# Patient Record
Sex: Female | Born: 1975 | Race: Black or African American | Hispanic: No | Marital: Single | Smoking: Former smoker
Health system: Southern US, Community
[De-identification: ages and names within clinical notes are randomized; demographics above are authoritative.]

## PROBLEM LIST (undated history)

## (undated) DIAGNOSIS — J45909 Unspecified asthma, uncomplicated: Secondary | ICD-10-CM

## (undated) DIAGNOSIS — R519 Headache, unspecified: Secondary | ICD-10-CM

## (undated) DIAGNOSIS — R51 Headache: Secondary | ICD-10-CM

## (undated) HISTORY — DX: Headache, unspecified: R51.9

## (undated) HISTORY — DX: Headache: R51

## (undated) HISTORY — PX: TUBAL LIGATION: SHX77

---

## 2003-08-23 HISTORY — PX: OOPHORECTOMY: SHX86

## 2004-06-13 ENCOUNTER — Emergency Department: Payer: Self-pay | Admitting: Emergency Medicine

## 2004-08-23 ENCOUNTER — Emergency Department: Payer: Self-pay | Admitting: Emergency Medicine

## 2004-08-24 ENCOUNTER — Emergency Department: Payer: Self-pay | Admitting: Emergency Medicine

## 2004-08-26 ENCOUNTER — Emergency Department: Payer: Self-pay | Admitting: Unknown Physician Specialty

## 2004-09-10 ENCOUNTER — Ambulatory Visit: Payer: Self-pay | Admitting: Family Medicine

## 2004-09-21 ENCOUNTER — Ambulatory Visit: Payer: Self-pay | Admitting: Neurology

## 2004-11-07 ENCOUNTER — Emergency Department: Payer: Self-pay | Admitting: Unknown Physician Specialty

## 2005-01-20 ENCOUNTER — Observation Stay: Payer: Self-pay

## 2005-02-07 ENCOUNTER — Observation Stay: Payer: Self-pay | Admitting: Obstetrics and Gynecology

## 2005-02-08 ENCOUNTER — Observation Stay: Payer: Self-pay | Admitting: Obstetrics and Gynecology

## 2005-02-20 ENCOUNTER — Observation Stay: Payer: Self-pay | Admitting: Obstetrics and Gynecology

## 2005-02-21 ENCOUNTER — Observation Stay: Payer: Self-pay | Admitting: Obstetrics and Gynecology

## 2005-02-24 ENCOUNTER — Observation Stay: Payer: Self-pay | Admitting: Obstetrics and Gynecology

## 2005-03-01 ENCOUNTER — Inpatient Hospital Stay: Payer: Self-pay

## 2005-07-01 ENCOUNTER — Emergency Department: Payer: Self-pay | Admitting: Emergency Medicine

## 2006-07-09 ENCOUNTER — Emergency Department: Payer: Self-pay | Admitting: Emergency Medicine

## 2007-11-26 ENCOUNTER — Observation Stay: Payer: Self-pay

## 2007-12-07 ENCOUNTER — Observation Stay: Payer: Self-pay | Admitting: Obstetrics and Gynecology

## 2007-12-10 ENCOUNTER — Observation Stay: Payer: Self-pay

## 2007-12-22 ENCOUNTER — Inpatient Hospital Stay: Payer: Self-pay | Admitting: Obstetrics and Gynecology

## 2009-12-25 ENCOUNTER — Emergency Department: Payer: Self-pay | Admitting: Internal Medicine

## 2010-02-11 ENCOUNTER — Emergency Department: Payer: Self-pay | Admitting: Emergency Medicine

## 2010-02-20 ENCOUNTER — Emergency Department: Payer: Self-pay | Admitting: Emergency Medicine

## 2010-04-29 ENCOUNTER — Emergency Department: Payer: Self-pay | Admitting: Emergency Medicine

## 2012-01-09 ENCOUNTER — Emergency Department: Payer: Self-pay | Admitting: Emergency Medicine

## 2012-04-23 ENCOUNTER — Emergency Department: Payer: Self-pay | Admitting: Emergency Medicine

## 2012-06-27 ENCOUNTER — Emergency Department: Payer: Self-pay

## 2012-07-02 ENCOUNTER — Encounter: Payer: Self-pay | Admitting: Family Medicine

## 2012-07-22 ENCOUNTER — Encounter: Payer: Self-pay | Admitting: Family Medicine

## 2012-11-05 ENCOUNTER — Emergency Department: Payer: Self-pay | Admitting: Unknown Physician Specialty

## 2012-11-05 LAB — URINALYSIS, COMPLETE
Bilirubin,UR: NEGATIVE
Blood: NEGATIVE
Glucose,UR: NEGATIVE mg/dL (ref 0–75)
Hyaline Cast: 1
Ketone: NEGATIVE
Nitrite: POSITIVE
Protein: 30
WBC UR: 10 /HPF (ref 0–5)

## 2013-06-19 ENCOUNTER — Encounter: Payer: Self-pay | Admitting: Family Medicine

## 2013-06-22 ENCOUNTER — Encounter: Payer: Self-pay | Admitting: Family Medicine

## 2013-07-22 ENCOUNTER — Encounter: Payer: Self-pay | Admitting: Family Medicine

## 2013-08-22 ENCOUNTER — Encounter: Payer: Self-pay | Admitting: Family Medicine

## 2016-08-13 ENCOUNTER — Emergency Department
Admission: EM | Admit: 2016-08-13 | Discharge: 2016-08-13 | Disposition: A | Discharge status: Home / Self Care | Payer: 59 | Attending: Student in an Organized Health Care Education/Training Program | Admitting: Student in an Organized Health Care Education/Training Program

## 2016-08-13 ENCOUNTER — Emergency Department: Payer: 59

## 2016-08-13 DIAGNOSIS — R0602 Shortness of breath: Secondary | ICD-10-CM | POA: Diagnosis present

## 2016-08-13 DIAGNOSIS — J45909 Unspecified asthma, uncomplicated: Secondary | ICD-10-CM | POA: Diagnosis not present

## 2016-08-13 DIAGNOSIS — J209 Acute bronchitis, unspecified: Secondary | ICD-10-CM | POA: Insufficient documentation

## 2016-08-13 DIAGNOSIS — Z87891 Personal history of nicotine dependence: Secondary | ICD-10-CM | POA: Diagnosis not present

## 2016-08-13 HISTORY — DX: Unspecified asthma, uncomplicated: J45.909

## 2016-08-13 LAB — CBC
HEMATOCRIT: 34.9 % — AB (ref 35.0–47.0)
Hemoglobin: 12.1 g/dL (ref 12.0–16.0)
MCH: 30.4 pg (ref 26.0–34.0)
MCHC: 34.8 g/dL (ref 32.0–36.0)
MCV: 87.3 fL (ref 80.0–100.0)
Platelets: 220 10*3/uL (ref 150–440)
RBC: 3.99 MIL/uL (ref 3.80–5.20)
RDW: 13.2 % (ref 11.5–14.5)
WBC: 7.7 10*3/uL (ref 3.6–11.0)

## 2016-08-13 LAB — BASIC METABOLIC PANEL
Anion gap: 6 (ref 5–15)
BUN: 12 mg/dL (ref 6–20)
CHLORIDE: 109 mmol/L (ref 101–111)
CO2: 25 mmol/L (ref 22–32)
Calcium: 8.7 mg/dL — ABNORMAL LOW (ref 8.9–10.3)
Creatinine, Ser: 0.83 mg/dL (ref 0.44–1.00)
GFR calc non Af Amer: >60 mL/min (ref >60)
Glucose, Bld: 114 mg/dL — ABNORMAL HIGH (ref 65–99)
POTASSIUM: 3.3 mmol/L — AB (ref 3.5–5.1)
SODIUM: 140 mmol/L (ref 135–145)

## 2016-08-13 LAB — TROPONIN I: Troponin I: <0.03 ng/mL (ref <0.03)

## 2016-08-13 MED ORDER — ALBUTEROL SULFATE HFA 108 (90 BASE) MCG/ACT IN AERS: 2.0000 | INHALATION_SPRAY | Freq: Four times a day (QID) | RESPIRATORY_TRACT | 0 refills | Status: DC | PRN

## 2016-08-13 MED ORDER — IPRATROPIUM-ALBUTEROL 0.5-2.5 (3) MG/3ML IN SOLN
3.0000 mL | Freq: Once | RESPIRATORY_TRACT | Status: AC
  Administered 2016-08-13: 3 mL via RESPIRATORY_TRACT
  Filled 2016-08-13: qty 3

## 2016-08-13 MED ORDER — PREDNISONE 20 MG PO TABS
60.0000 mg | ORAL_TABLET | Freq: Once | ORAL | Status: AC
  Administered 2016-08-13: 60 mg via ORAL
  Filled 2016-08-13: qty 3

## 2016-08-13 MED ORDER — PREDNISONE 20 MG PO TABS: 40.0000 mg | ORAL_TABLET | Freq: Every day | ORAL | 0 refills | Status: AC

## 2016-08-13 NOTE — ED Notes (Signed)
Patient ambulated in the room, pulse ox stayed at 98%, pulse 95.

## 2016-08-13 NOTE — ED Triage Notes (Signed)
Pt reports to ED w/ c/o SOB and CP.  Pt sts that S/S have been ongoing >1 week, feels like getting worse today.  Pt alert and oriented, ambulatory to triage room.  Pt sts pain worse when moving around at work.  Reports intermittent cough.

## 2016-08-13 NOTE — ED Provider Notes (Signed)
[facility]    First MD Initiated Contact with Patient 08/13/16 1450     (approximate)  I have reviewed the triage vital signs and the nursing notes.   HISTORY  Chief Complaint Chest Pain and Shortness of Breath    HPI Norma Dunn is a 40 y.o. female  with a history of asthma presents with 1 week of worsening shortness of breath and intermittent chest pain. Denies any fevers. States that the shortness of breath is worse with exertion. Has had an intermittent cough but is nonproductive. States that she did have resident chest pain that started early this morning while at work. Denies any nausea or vomiting. No diaphoresis. No previous history of heart disease.   Past Medical History:  Diagnosis Date  . Asthma    No family history on file. Past Surgical History:  Procedure Laterality Date  . OOPHORECTOMY  2005   right  . TUBAL LIGATION     left  . TUBAL LIGATION     left   There are no active problems to display for this patient.     Prior to Admission medications   Not on File    Allergies Latex    Social History Social History  Substance Use Topics  . Smoking status: Former Games developermoker  . Smokeless tobacco: Never Used  . Alcohol use No    Review of Systems Patient denies headaches, rhinorrhea, blurry vision, numbness, shortness of breath, chest pain, edema, cough, abdominal pain, nausea, vomiting, diarrhea, dysuria, fevers, rashes or hallucinations unless otherwise stated above in HPI. ____________________________________________   PHYSICAL EXAM:  VITAL SIGNS: Vitals:   08/13/16 1320 08/13/16 1455  BP: 96/69 (!) 120/93  Pulse: 76 71  Resp: 18 16  Temp: 98.3 F (36.8 C)     Constitutional: Alert and oriented. Well appearing and in no acute distress. Eyes: Conjunctivae are normal. PERRL. EOMI. Head: Atraumatic. Nose: No congestion/rhinnorhea. Mouth/Throat: Mucous membranes are moist.   Oropharynx non-erythematous. Neck: No stridor. Painless ROM. No cervical spine tenderness to palpation Hematological/Lymphatic/Immunilogical: No cervical lymphadenopathy. Cardiovascular: Normal rate, regular rhythm. Grossly normal heart sounds.  Good peripheral circulation. Respiratory: Normal respiratory effort.  No retractions. Lungs with faint bilateral end expiratory wheeze Gastrointestinal: Soft and nontender. No distention. No abdominal bruits. No CVA tenderness. Genitourinary:  Musculoskeletal: No lower extremity tenderness nor edema.  No joint effusions. Neurologic:  Normal speech and language. No gross focal neurologic deficits are appreciated. No gait instability. Skin:  Skin is warm, dry and intact. No rash noted. Psychiatric: Mood and affect are normal. Speech and behavior are normal.  ____________________________________________   LABS (all labs ordered are listed, but only abnormal results are displayed)  Results for orders placed or performed during the hospital encounter of 08/13/16 (from the past 24 hour(s))  Basic metabolic panel     Status: Abnormal   Collection Time: 08/13/16  1:24 PM  Result Value Ref Range   Sodium 140 135 - 145 mmol/L   Potassium 3.3 (L) 3.5 - 5.1 mmol/L   Chloride 109 101 - 111 mmol/L   CO2 25 22 - 32 mmol/L   Glucose, Bld 114 (H) 65 - 99 mg/dL   BUN 12 6 - 20 mg/dL   Creatinine, Ser 1.610.83 0.44 - 1.00 mg/dL   Calcium 8.7 (L) 8.9 - 10.3 mg/dL   GFR calc non Af Amer >60 >60 mL/min   GFR calc Af Amer >60 >60 mL/min   Anion gap 6  5 - 15  CBC     Status: Abnormal   Collection Time: 08/13/16  1:24 PM  Result Value Ref Range   WBC 7.7 3.6 - 11.0 K/uL   RBC 3.99 3.80 - 5.20 MIL/uL   Hemoglobin 12.1 12.0 - 16.0 g/dL   HCT 16.134.9 (L) 09.635.0 - 04.547.0 %   MCV 87.3 80.0 - 100.0 fL   MCH 30.4 26.0 - 34.0 pg   MCHC 34.8 32.0 - 36.0 g/dL   RDW 40.913.2 81.111.5 - 91.414.5 %   Platelets 220 150 - 440 K/uL  Troponin I     Status: None   Collection Time: 08/13/16   1:24 PM  Result Value Ref Range   Troponin I <0.03 <0.03 ng/mL   ____________________________________________  EKG My review and personal interpretation at Time: 13:22   Indication: sob  Rate: 80  Rhythm: sinus Axis: normal Other: non specific st changes, no acute ischemia ____________________________________________  RADIOLOGY  I personally reviewed all radiographic images ordered to evaluate for the above acute complaints and reviewed radiology reports and findings.  These findings were personally discussed with the patient.  Please see medical record for radiology report.____________________________________________   PROCEDURES  Procedure(s) performed:  Procedures    Critical Care performed: no ____________________________________________   INITIAL IMPRESSION / ASSESSMENT AND PLAN / ED COURSE  Pertinent labs & imaging results that were available during my care of the patient were reviewed by me and considered in my medical decision making (see chart for details).  DDX: Asthma, copd, CHF, pna, ptx, malignancy, Pe, anemia  Norma Dunn is a 40 y.o. who presents to the ED with chest pain shortness of breath. Presentation is most consistent with acute bronchitis possible asthma. She is low risk Wells criteria and is PERC negative. Her EKG shows no evidence of acute ischemia. Blood work is otherwise reassuring. We will order nebulizer treatment and started on steroids for bronchitis. Her chest X rashes no evidence of consolidation.  Clinical Course as of Aug 13 2128  Sat Aug 13, 2016  1749 Patient was able to tolerate PO and was able to ambulate with a steady gait.  Have discussed with the patient and available family all diagnostics and treatments performed thus far and all questions were answered to the best of my ability. The patient demonstrates understanding and agreement with plan.   [PR]    Clinical Course User Index [PR] Willy EddyPatrick Samanthan Dugo, MD      ____________________________________________   FINAL CLINICAL IMPRESSION(S) / ED DIAGNOSES  Final diagnoses:  Acute bronchitis, unspecified organism      NEW MEDICATIONS STARTED DURING THIS VISIT:  New Prescriptions   No medications on file     Note:  This document was prepared using Dragon voice recognition software and may include unintentional dictation errors.    Willy EddyPatrick Nils Thor, MD 08/13/16 [phone]

## 2016-11-11 ENCOUNTER — Encounter: Payer: Self-pay | Admitting: Emergency Medicine

## 2016-11-11 ENCOUNTER — Emergency Department
Admission: EM | Admit: 2016-11-11 | Discharge: 2016-11-11 | Disposition: A | Discharge status: Home / Self Care | Payer: 59 | Attending: Emergency Medicine | Admitting: Emergency Medicine

## 2016-11-11 DIAGNOSIS — J45909 Unspecified asthma, uncomplicated: Secondary | ICD-10-CM | POA: Insufficient documentation

## 2016-11-11 DIAGNOSIS — G43909 Migraine, unspecified, not intractable, without status migrainosus: Secondary | ICD-10-CM | POA: Diagnosis present

## 2016-11-11 DIAGNOSIS — Z87891 Personal history of nicotine dependence: Secondary | ICD-10-CM | POA: Insufficient documentation

## 2016-11-11 DIAGNOSIS — G43001 Migraine without aura, not intractable, with status migrainosus: Secondary | ICD-10-CM | POA: Insufficient documentation

## 2016-11-11 DIAGNOSIS — Z79899 Other long term (current) drug therapy: Secondary | ICD-10-CM | POA: Diagnosis not present

## 2016-11-11 MED ORDER — DIPHENHYDRAMINE HCL 50 MG/ML IJ SOLN
12.5000 mg | Freq: Once | INTRAMUSCULAR | Status: AC
  Administered 2016-11-11: 12.5 mg via INTRAVENOUS
  Filled 2016-11-11: qty 1

## 2016-11-11 MED ORDER — KETOROLAC TROMETHAMINE 30 MG/ML IJ SOLN
30.0000 mg | Freq: Once | INTRAMUSCULAR | Status: AC
  Administered 2016-11-11: 30 mg via INTRAVENOUS
  Filled 2016-11-11: qty 1

## 2016-11-11 MED ORDER — METOCLOPRAMIDE HCL 5 MG/ML IJ SOLN
10.0000 mg | Freq: Once | INTRAMUSCULAR | Status: AC
  Administered 2016-11-11: 10 mg via INTRAVENOUS
  Filled 2016-11-11: qty 2

## 2016-11-11 NOTE — ED Provider Notes (Signed)
[facility] Emergency Department Provider Note   First MD Initiated Contact with Patient 11/11/16 [phone]     (approximate)  I have reviewed the triage vital signs and the nursing notes.   HISTORY  Chief Complaint Migraine    HPI Norma Dunn is a 41 y.o. female presents with history of migraine headache current "migraine 1 week. Patient states however for the past month her headaches have been intermittent. Patient states that she was given a prescription for sumatriptan. Patient states current pain score 7 out of 10. Patient denies any weakness numbness gait instability or visual changes. Patient denies any vomiting. Patient denies any fever neck pain or stiffness. Patient states current migraine is consistent with previous migraine headaches.   Past Medical History:  Diagnosis Date  . Asthma     There are no active problems to display for this patient.   Past Surgical History:  Procedure Laterality Date  . OOPHORECTOMY  2005   right  . TUBAL LIGATION     left  . TUBAL LIGATION     left    Prior to Admission medications   Medication Sig Start Date End Date Taking? Authorizing Provider  albuterol (PROVENTIL HFA;VENTOLIN HFA) 108 (90 Base) MCG/ACT inhaler Inhale 2 puffs into the lungs every 6 (six) hours as needed for wheezing or shortness of breath. 08/13/16  Yes Willy Eddy, MD  beclomethasone (QVAR) 80 MCG/ACT inhaler Inhale 2 puffs into the lungs 2 (two) times daily.   Yes Historical Provider, MD  SUMAtriptan (IMITREX) 50 MG tablet Take 50 mg by mouth every 2 (two) hours as needed for migraine or headache. May repeat in 2 hours if headache persists or recurs.   Yes Historical Provider, MD    Allergies Latex  No family history on file.  Social History Social History  Substance Use Topics  . Smoking status: Former Games developer  . Smokeless tobacco: Never Used  . Alcohol use No    Review of Systems Constitutional: No  fever/chills Eyes: No visual changes. ENT: No sore throat. Cardiovascular: Denies chest pain. Respiratory: Denies shortness of breath. Gastrointestinal: No abdominal pain.  No nausea, no vomiting.  No diarrhea.  No constipation. Genitourinary: Negative for dysuria. Musculoskeletal: Negative for back pain. Skin: Negative for rash. Neurological: Positive for headaches, negative for focal weakness or numbness.  10-point ROS otherwise negative.  ____________________________________________   PHYSICAL EXAM:  VITAL SIGNS: ED Triage Vitals [11/11/16 0428]  Enc Vitals Group     BP (!) 126/91     Pulse Rate 69     Resp 18     Temp 98.5 F (36.9 C)     Temp Source Oral     SpO2 99 %     Weight 150 lb (68 kg)     Height 5\' 4"  (1.626 m)     Head Circumference      Peak Flow      Pain Score 8     Pain Loc      Pain Edu?      Excl. in GC?     Constitutional: Alert and oriented. Well appearing and in no acute distress. Eyes: Conjunctivae are normal. PERRL. EOMI. Head: Atraumatic. Mouth/Throat: Mucous membranes are moist. Oropharynx non-erythematous. Neck: No stridor.  No meningeal signs.   Cardiovascular: Normal rate, regular rhythm. Good peripheral circulation. Grossly normal heart sounds. Respiratory: Normal respiratory effort.  No retractions. Lungs CTAB. Gastrointestinal: Soft and nontender. No distention.  Musculoskeletal: No lower extremity tenderness nor  edema. No gross deformities of extremities. Neurologic:  Normal speech and language. No gross focal neurologic deficits are appreciated.  Skin:  Skin is warm, dry and intact. No rash noted. Psychiatric: Mood and affect are normal. Speech and behavior are normal.       Procedures   ____________________________________________   INITIAL IMPRESSION / ASSESSMENT AND PLAN / ED COURSE  Pertinent labs & imaging results that were available during my care of the patient were reviewed by me and considered in my medical  decision making (see chart for details).  Patient given IV Toradol Reglan and Benadryl with complete resolution of headache. In absence of any focal neurological deficits on exam and migraine headache consistent with previous no imaging was performed today.      ____________________________________________  FINAL CLINICAL IMPRESSION(S) / ED DIAGNOSES  Final diagnoses:  Migraine without aura and with status migrainosus, not intractable     MEDICATIONS GIVEN DURING THIS VISIT:  Medications  ketorolac (TORADOL) 30 MG/ML injection 30 mg (30 mg Intravenous Given 11/11/16 0445)  metoCLOPramide (REGLAN) injection 10 mg (10 mg Intravenous Given 11/11/16 0444)  diphenhydrAMINE (BENADRYL) injection 12.5 mg (12.5 mg Intravenous Given 11/11/16 0444)     NEW OUTPATIENT MEDICATIONS STARTED DURING THIS VISIT:  New Prescriptions   No medications on file    Modified Medications   No medications on file    Discontinued Medications   ALBUTEROL SULFATE (VENTOLIN HFA IN)    Inhale 2 puffs into the lungs daily.     Note:  This document was prepared using Dragon voice recognition software and may include unintentional dictation errors.    Darci Currentandolph N Laylah Riga, MD 11/11/16 [phone]

## 2016-11-11 NOTE — ED Triage Notes (Signed)
Has had migraine for a month, went to pmd and got rx that did not give her relief.

## 2017-04-20 ENCOUNTER — Encounter: Payer: Self-pay | Admitting: Neurology

## 2017-07-20 ENCOUNTER — Ambulatory Visit (INDEPENDENT_AMBULATORY_CARE_PROVIDER_SITE_OTHER): Payer: 59 | Admitting: Neurology

## 2017-07-20 ENCOUNTER — Encounter: Payer: Self-pay | Admitting: Neurology

## 2017-07-20 VITALS — BP 100/72 | HR 73 | Ht 65.0 in | Wt 157.4 lb

## 2017-07-20 DIAGNOSIS — G43019 Migraine without aura, intractable, without status migrainosus: Secondary | ICD-10-CM

## 2017-07-20 NOTE — Patient Instructions (Signed)
Migraine Recommendations: 1.  Start nortriptyline 25mg  at bedtime.  Call in 8 weeks ate and we can adjust dose if needed. 2.  Take rizatriptan 10mg  at earliest onset of headache.  May repeat dose once in 2 hours if needed.  Do not exceed two tablets in 24 hours. 3.  Stop ibuprofen and Excedrin.  Limit use of pain relievers to no more than 2 days out of the week.  These medications include acetaminophen, ibuprofen, triptans and narcotics.  This will help reduce risk of rebound headaches. 4.  Be aware of common food triggers such as processed sweets, processed foods with nitrites (such as deli meat, hot dogs, sausages), foods with MSG, alcohol (such as wine), chocolate, certain cheeses, certain fruits (dried fruits, bananas, some citrus fruit), vinegar, diet soda. 4.  Avoid caffeine 5.  Routine exercise 6.  Proper sleep hygiene 7.  Stay adequately hydrated with water 8.  Keep a headache diary. 9.  Maintain proper stress management. 10.  Do not skip meals. 11.  Consider supplements:  Magnesium citrate 400mg  to 600mg  daily, riboflavin 400mg , Coenzyme Q 10 100mg  three times daily 12.  Follow up in 3 months.

## 2017-07-20 NOTE — Progress Notes (Signed)
NEUROLOGY CONSULTATION NOTE  Norma Dunn MRN: [id number] DOB: 1975/09/11  Referring provider: Shane Crutch, PA-C Primary care provider: Shane Crutch, PA-C  Reason for consult:  migraines  HISTORY OF PRESENT ILLNESS: Norma Dunn is a 41 year old female who presents for migraines.  History supplemented by ED note.  Onset:  Since her 7s. Location:  Usually bi-frontal or either side Quality:  Pressure, sometimes sharp Intensity:  Moderate to severe Aura:  no Prodrome:  no Postdrome:  Sometimes fatigue Associated symptoms:  Nausea, photophobia, phonophobia, blurred vision in left eye, no vomiting.  She has not had any new worse headache of her life, waking up from sleep Duration:  Usually 2 days (but up to a week) Frequency:  Varies.  Some months up to 18 days per month but she may have 1 or 2 months with little or no headache. Frequency of abortive medication: as needed. Triggers/exacerbating factors:  Menstrual cycle, stress Relieving factors:  nothing Activity:  Aggravates.  Cannot function or go to work 5 days a month.  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  Past NSAIDS:  no Past analgesics:  no Past abortive triptans:  Sumatriptan (ineffective) Past muscle relaxants:  no Past anti-emetic:  Reglan (effective) Past antihypertensive medications:  no Past antidepressant medications:  no Past anticonvulsant medications:  no Past vitamins/Herbal/Supplements:  no Other past therapies:  no  Current NSAIDS:  ibuprofen 800mg  Current analgesics:  Excedrin Current triptans:  no Current anti-emetic:  Promethazine 25mg  (effective but causes drowsiness) Current muscle relaxants:  no Current anti-anxiolytic:  no Current sleep aide:  no Current Antihypertensive medications:  no Current  Antidepressant medications:  no Current Anticonvulsant medications:  no Current Vitamins/Herbal/Supplements:  no Current Antihistamines/Decongestants:  no Other therapy:  no  Caffeine:  rarely Alcohol:  no Smoker:  no Diet:  Hydrates.  Rarely soda.  Watches diet. 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 Asthma   . Headache     PAST SURGICAL HISTORY: Past Surgical History:  Procedure Laterality Date  . OOPHORECTOMY  2005   right  . TUBAL LIGATION     left  . TUBAL LIGATION     left    MEDICATIONS: Current Outpatient Medications on File Prior to Visit  Medication Sig Dispense Refill  . aspirin-acetaminophen-caffeine (EXCEDRIN MIGRAINE) 250-250-65 MG tablet Take 1 tablet by mouth every 6 (six) hours as needed for headache.    . albuterol (PROVENTIL HFA;VENTOLIN HFA) 108 (90 Base) MCG/ACT inhaler Inhale 2 puffs into the lungs every 6 (six) hours as needed for wheezing or shortness of breath. 1 Inhaler 0  . beclomethasone (QVAR) 80 MCG/ACT inhaler Inhale 2 puffs into the lungs 2 (two) times daily.    . IBU 800 MG tablet Take 1 tablet by mouth 2 (two) times daily as needed.    . SUMAtriptan (IMITREX) 50 MG tablet Take 50 mg by mouth every 2 (two) hours as needed for migraine or headache. May repeat in 2 hours if headache persists or recurs.     No current facility-administered medications on file prior to visit.     ALLERGIES: Allergies  Allergen Reactions  . Latex Itching    FAMILY HISTORY: Family  History  Problem Relation Age of Onset  . Migraines Father     SOCIAL HISTORY: Social History   Socioeconomic History  . Marital status: Unknown    Spouse name: Not on file  . Number of children: 3  . Years of education: Not on file  . Highest education level: Some college, no degree  Social Needs  .  Financial resource strain: Not on file  . Food insecurity - worry: Not on file  . Food insecurity - inability: Not on file  . Transportation needs - medical: Not on file  . Transportation needs - non-medical: Not on file  Occupational History  . Occupation: Surveyor, mineralspostal clerk    Employer: [employer]  Tobacco Use  . Smoking status: Former Games developermoker  . Smokeless tobacco: Never Used  Substance and Sexual Activity  . Alcohol use: No  . Drug use: Not on file  . Sexual activity: Not on file  Other Topics Concern  . Not on file  Social History Narrative   Single, lives with her 3 children. Has been avoiding caffeine. Exercises 2-3 x week for 60 minutes or more.    REVIEW OF SYSTEMS: Constitutional: No fevers, chills, or sweats, no generalized fatigue, change in appetite Eyes: No visual changes, double vision, eye pain Ear, nose and throat: No hearing loss, ear pain, nasal congestion, sore throat Cardiovascular: No chest pain, palpitations Respiratory:  No shortness of breath at rest or with exertion, wheezes GastrointestinaI: No nausea, vomiting, diarrhea, abdominal pain, fecal incontinence Genitourinary:  No dysuria, urinary retention or frequency Musculoskeletal:  No neck pain, back pain Integumentary: No rash, pruritus, skin lesions Neurological: as above Psychiatric: No depression, insomnia, anxiety Endocrine: No palpitations, fatigue, diaphoresis, mood swings, change in appetite, change in weight, increased thirst Hematologic/Lymphatic:  No purpura, petechiae. Allergic/Immunologic: no itchy/runny eyes, nasal congestion, recent allergic reactions, rashes  PHYSICAL EXAM: Vitals:   07/20/17 0745  BP: 100/72  Pulse: 73  SpO2: 98%   General: No acute distress.  Patient appears well-groomed.  Head:  Normocephalic/atraumatic Eyes:  fundi examined but not visualized Neck: supple, no paraspinal tenderness, full range of motion Back: No paraspinal tenderness Heart: regular rate and  rhythm Lungs: Clear to auscultation bilaterally. Vascular: No carotid bruits. Neurological Exam: Mental status: alert and oriented to person, place, and time, recent and remote memory intact, fund of knowledge intact, attention and concentration intact, speech fluent and not dysarthric, language intact. Cranial nerves: CN I: not tested CN II: pupils equal, round and reactive to light, visual fields intact CN III, IV, VI:  full range of motion, no nystagmus, no ptosis CN V: facial sensation intact CN VII: upper and lower face symmetric CN VIII: hearing intact CN IX, X: gag intact, uvula midline CN XI: sternocleidomastoid and trapezius muscles intact CN XII: tongue midline Bulk & Tone: normal, no fasciculations. Motor:  5/5 throughout  Sensation: temperature and vibration sensation intact. Deep Tendon Reflexes:  2+ throughout, toes downgoing.  Finger to nose testing:  Without dysmetria.  Heel to shin:  Without dysmetria.  Gait:  Normal station and stride.  Able to turn and tandem walk. Romberg negative.  IMPRESSION: Migraine without aura  PLAN: 1.  Start nortriptyline 25mg  at bedtime 2.  Stop ibuprofen and Excedrin.  For abortive therapy, start rizatriptan 10mg  (limit to no more than 2 days out of week to prevent rebound headache) 3.  Follow up in 3 months.  Thank you for allowing me to take part in the care of this patient.  Shon MilletAdam Jaffe, DO  CC:  Shane CrutchLinda Markley, PA-C

## 2017-07-21 ENCOUNTER — Telehealth: Payer: Self-pay | Admitting: Neurology

## 2017-07-21 MED ORDER — NORTRIPTYLINE HCL 25 MG PO CAPS: 25.0000 mg | ORAL_CAPSULE | Freq: Every day | ORAL | 3 refills | Status: DC

## 2017-07-21 MED ORDER — RIZATRIPTAN BENZOATE 10 MG PO TBDP: 10.0000 mg | ORAL_TABLET | ORAL | 3 refills | Status: DC | PRN

## 2017-07-21 NOTE — Telephone Encounter (Signed)
Called Pt, advsd her Rxs have been sent in. Reminded her not to take more than 2 rizatriptans in a 24 hour period. Pt verbalized understanding.

## 2017-07-21 NOTE — Telephone Encounter (Signed)
Patient called and needs to have her medication sent into [facility] in [place]. She said they have not received anything. Thanks

## 2017-08-07 ENCOUNTER — Telehealth: Payer: Self-pay | Admitting: Neurology

## 2017-08-07 NOTE — Telephone Encounter (Signed)
Pt left a voicemail message saying the pharmacist has not received her prescription refill

## 2017-08-07 NOTE — Telephone Encounter (Signed)
Called and spoke w/Pt. She picked upm her nortriptyline on 12/1, rizatriotan was not ready. She has not rcvd notice it was available. Advsd her was sent in at same time as nortrip on 07/21/17. Called pharmacy, spoke with Dieu-hi, she said wasn't filled, but will fill now.

## 2017-10-23 ENCOUNTER — Encounter: Payer: Self-pay | Admitting: Neurology

## 2017-10-23 ENCOUNTER — Ambulatory Visit (INDEPENDENT_AMBULATORY_CARE_PROVIDER_SITE_OTHER): Payer: 59 | Admitting: Neurology

## 2017-10-23 VITALS — BP 100/70 | HR 75 | Ht 65.0 in | Wt 160.1 lb

## 2017-10-23 DIAGNOSIS — G43009 Migraine without aura, not intractable, without status migrainosus: Secondary | ICD-10-CM | POA: Diagnosis not present

## 2017-10-23 MED ORDER — ONDANSETRON HCL 4 MG PO TABS: 4.0000 mg | ORAL_TABLET | Freq: Three times a day (TID) | ORAL | 3 refills | Status: DC | PRN

## 2017-10-23 NOTE — Patient Instructions (Signed)
Migraine Recommendations: 1.  Continue nortriptyline 25mg  at bedtime 2.  Take rizatriptan 10mg  at earliest onset of headache.  May repeat dose once in 2 hours if needed.  Do not exceed two tablets in 24 hours.  For nausea, take ondansetron 4mg . 3.  Limit use of pain relievers to no more than 2 days out of the week.  These medications include acetaminophen, ibuprofen, triptans and narcotics.  This will help reduce risk of rebound headaches. 4.  Be aware of common food triggers such as processed sweets, processed foods with nitrites (such as deli meat, hot dogs, sausages), foods with MSG, alcohol (such as wine), chocolate, certain cheeses, certain fruits (dried fruits, bananas, some citrus fruit), vinegar, diet soda. 4.  Avoid caffeine 5.  Routine exercise 6.  Proper sleep hygiene 7.  Stay adequately hydrated with water 8.  Keep a headache diary. 9.  Maintain proper stress management. 10.  Do not skip meals. 11.  Consider supplements:  Magnesium citrate 400mg  to 600mg  daily, riboflavin 400mg , Coenzyme Q 10 100mg  three times daily 12.  Follow up in 4 months.

## 2017-10-23 NOTE — Progress Notes (Signed)
NEUROLOGY FOLLOW UP OFFICE NOTE  BEYA TIPPS [id number]  HISTORY OF PRESENT ILLNESS: Norma Dunn is a 42 year old female who follows up for migraines.  UPDATE: She is feeling better. Intensity:  Moderate-severe Duration:  Usually 1 to 2 hours Frequency:  Less than 5 days a month Frequency of abortive medication:  Less than 5 days a month. Rescue protocol:  Rizatriptan (repeats dose after 2 hours 10% of time) Current NSAIDS:  no Current analgesics:  no Current triptans:  rizatriptan 10mg  Current anti-emetic:  Promethazine 25mg  (effective but causes drowsiness) Current muscle relaxants:  no Current anti-anxiolytic:  no Current sleep aide:  no Current Antihypertensive medications:  no Current Antidepressant medications:  nortriptyline 25mg  Current Anticonvulsant medications:  no Current Vitamins/Herbal/Supplements:  no Current Antihistamines/Decongestants:  no Other therapy:  no   Caffeine:  rarely Alcohol:  no Smoker:  no Diet:  Hydrates.  Rarely soda.  Watches diet. Exercise:  yes Depression/anxiety:  No Sleep hygiene:  varies  HISTORY:  Onset:  Since her 4s. Location:  Usually bi-frontal or either side Quality:  Pressure, sometimes sharp Initial Intensity:  Moderate to severe Aura:  no Prodrome:  no Postdrome:  Sometimes fatigue Associated symptoms:  Nausea, photophobia, phonophobia, blurred vision in left eye, no vomiting.  She has not had any new worse headache of her life, waking up from sleep Initial Duration:  Usually 2 days (but up to a week) Initial Frequency:  Varies.  Some months up to 18 days per month but she may have 1 or 2 months with little or no headache. Initial Frequency of abortive medication: as needed. Triggers/exacerbating factors:  Menstrual cycle, stress Relieving factors:  nothing Activity:  Aggravates.  Cannot function or go to work 5 days a month.   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   Past NSAIDS:  Ibuprofen 800mg  Past analgesics:  Excedrin Past abortive triptans:  Sumatriptan (ineffective) Past muscle relaxants:  no Past anti-emetic:  Reglan (effective) Past antihypertensive medications:  no Past antidepressant medications:  no Past anticonvulsant medications:  no Past vitamins/Herbal/Supplements:  no Other past therapies:  no   Family history of headache:  Father (migraines)  PAST MEDICAL HISTORY: Past Medical History:  Diagnosis Date  . Asthma   . Headache     MEDICATIONS: Current Outpatient Medications on File Prior to Visit  Medication Sig Dispense Refill  . albuterol (PROVENTIL HFA;VENTOLIN HFA) 108 (90 Base) MCG/ACT inhaler Inhale 2 puffs into the lungs every 6 (six) hours as needed for wheezing or shortness of breath. 1 Inhaler 0  . aspirin-acetaminophen-caffeine (EXCEDRIN MIGRAINE) 250-250-65 MG tablet Take 1 tablet by mouth every 6 (six) hours as needed for headache.    . beclomethasone (QVAR) 80 MCG/ACT inhaler Inhale 2 puffs into the lungs 2 (two) times daily.    . IBU 800 MG tablet Take 1 tablet by mouth 2 (two) times daily as needed.    . nortriptyline (PAMELOR) 25 MG capsule Take 1 capsule (25 mg total) by mouth at bedtime. 30 capsule 3  . rizatriptan (MAXALT-MLT) 10 MG disintegrating tablet Take 1 tablet (10 mg total) by mouth as needed for migraine. May repeat in 2 hours if needed 9 tablet 3   No current facility-administered medications on file prior to visit.     ALLERGIES: Allergies  Allergen Reactions  . Latex  Itching    FAMILY HISTORY: Family History  Problem Relation Age of Onset  . Migraines Father     SOCIAL HISTORY: Social History   Socioeconomic History  . Marital status: Unknown    Spouse name: Not on file  . Number of children: 3  . Years of education: Not on file    . Highest education level: Some college, no degree  Social Needs  . Financial resource strain: Not on file  . Food insecurity - worry: Not on file  . Food insecurity - inability: Not on file  . Transportation needs - medical: Not on file  . Transportation needs - non-medical: Not on file  Occupational History  . Occupation: Surveyor, mineralspostal clerk    Employer: [employer]  Tobacco Use  . Smoking status: Former Games developermoker  . Smokeless tobacco: Never Used  Substance and Sexual Activity  . Alcohol use: No  . Drug use: Not on file  . Sexual activity: Not on file  Other Topics Concern  . Not on file  Social History Narrative   Single, lives with her 3 children. Has been avoiding caffeine. Exercises 2-3 x week for 60 minutes or more.    REVIEW OF SYSTEMS: Constitutional: No fevers, chills, or sweats, no generalized fatigue, change in appetite Eyes: No visual changes, double vision, eye pain Ear, nose and throat: No hearing loss, ear pain, nasal congestion, sore throat Cardiovascular: No chest pain, palpitations Respiratory:  No shortness of breath at rest or with exertion, wheezes GastrointestinaI: No nausea, vomiting, diarrhea, abdominal pain, fecal incontinence Genitourinary:  No dysuria, urinary retention or frequency Musculoskeletal:  No neck pain, back pain Integumentary: No rash, pruritus, skin lesions Neurological: as above Psychiatric: No depression, insomnia, anxiety Endocrine: No palpitations, fatigue, diaphoresis, mood swings, change in appetite, change in weight, increased thirst Hematologic/Lymphatic:  No purpura, petechiae. Allergic/Immunologic: no itchy/runny eyes, nasal congestion, recent allergic reactions, rashes  PHYSICAL EXAM: Vitals:   10/23/17 0733  BP: 100/70  Pulse: 75  SpO2: 98%   General: No acute distress.  Patient appears well-groomed.   Head:  Normocephalic/atraumatic Eyes:  Fundi examined but not visualized Neck: supple, no paraspinal tenderness, full range of  motion Heart:  Regular rate and rhythm Lungs:  Clear to auscultation bilaterally Back: No paraspinal tenderness Neurological Exam: alert and oriented to person, place, and time. Attention span and concentration intact, recent and remote memory intact, fund of knowledge intact.  Speech fluent and not dysarthric, language intact.  CN II-XII intact. Bulk and tone normal, muscle strength 5/5 throughout.  Sensation to light touch  intact.  Deep tendon reflexes 2+ throughout.  Finger to nose testing intact.  Gait normal, Romberg negative.  IMPRESSION: Migraine without aura, not intractable.    PLAN: 1.  Continue nortriptyline 25mg  at bedtime 2.  Rizatriptan 10mg  for abortive therapy.  For nausea, Zofran 4mg  (less sedating than promethazine) 3.  Limit use of pain relievers to no more than 2 days out of the week.  These medications include acetaminophen, ibuprofen, triptans and narcotics.  This will help reduce risk of rebound headaches. 4.  Be aware of common food triggers such as processed sweets, processed foods with nitrites (such as deli meat, hot dogs, sausages), foods with MSG, alcohol (such as wine), chocolate, certain cheeses, certain fruits (dried fruits, bananas, some citrus fruit), vinegar, diet soda. 4.  Avoid caffeine 5.  Routine exercise 6.  Proper sleep hygiene 7.  Stay adequately hydrated with water 8.  Keep a headache diary. 9.  Maintain  proper stress management. 10.  Do not skip meals. 11.  Consider supplements:  Magnesium citrate 400mg  to 600mg  daily, riboflavin 400mg , Coenzyme Q 10 100mg  three times daily 12.  Follow up in 4 months.  Shon Millet, DO  CC:  [facility]

## 2017-11-21 ENCOUNTER — Other Ambulatory Visit: Payer: Self-pay | Admitting: Neurology

## 2017-12-06 ENCOUNTER — Other Ambulatory Visit: Payer: Self-pay | Admitting: Neurology

## 2017-12-12 ENCOUNTER — Other Ambulatory Visit: Payer: Self-pay | Admitting: Nurse Practitioner

## 2017-12-12 DIAGNOSIS — Z1231 Encounter for screening mammogram for malignant neoplasm of breast: Secondary | ICD-10-CM

## 2017-12-18 ENCOUNTER — Encounter: Payer: Self-pay | Admitting: Neurology

## 2017-12-21 ENCOUNTER — Other Ambulatory Visit: Payer: Self-pay | Admitting: Neurology

## 2017-12-26 ENCOUNTER — Telehealth: Payer: Self-pay | Admitting: Neurology

## 2017-12-26 NOTE — Telephone Encounter (Signed)
Called Pt, advsd her Rx was sent in 12/22/17 for #90 to [facility]

## 2017-12-26 NOTE — Telephone Encounter (Signed)
*  STAT* If patient is at the pharmacy, call can be transferred to refill team.  1.     Which medications need to be refilled? (please list name of each medication and dose if know) Nortripyline  2.     Which pharmacy/location (including street and city if local pharmacy) is [facility]  3.     Do they need a 30 or 90 day supply 30

## 2018-01-11 ENCOUNTER — Ambulatory Visit
Admission: RE | Admit: 2018-01-11 | Discharge: 2018-01-11 | Disposition: A | Discharge status: Home / Self Care | Payer: 59 | Source: Ambulatory Visit | Attending: Nurse Practitioner | Admitting: Nurse Practitioner

## 2018-01-11 DIAGNOSIS — Z1231 Encounter for screening mammogram for malignant neoplasm of breast: Secondary | ICD-10-CM | POA: Insufficient documentation

## 2018-01-25 ENCOUNTER — Other Ambulatory Visit: Payer: Self-pay | Admitting: Neurology

## 2018-01-25 NOTE — Progress Notes (Signed)
[facility]  Pulmonary Medicine Consultation      Assessment and Plan:  Asthma exacerbation with dyspnea on exertion.  --Moderate baseline asthma with yearly exacerbation requiring ED visit.  --Will increase advair to 250 from 100. Continue singulair.  --Given nebulizer treatment today, continue prednisone course.  --Inhaler instruction given today, she has good inhaler technique.   Orders Placed This Encounter  Procedures  . Spirometry with Graph   Meds ordered this encounter  Medications  . Fluticasone-Salmeterol (ADVAIR DISKUS) 250-50 MCG/DOSE AEPB    Sig: Inhale 1 puff into the lungs 2 (two) times daily.    Dispense:  60 each    Refill:  5   Return in about 3 months (around 04/28/2018).     Date: 01/26/2018  MRN# [id number] Norma Dunn 26-Oct-1975  Norma Dunn is a 42 y.o. old female seen in consultation for chief complaint of:    Chief Complaint  Patient presents with  . Consult    Self referral asthma:pt c/o chest tightness, sob with exertion, and coughing.    HPI:  The patient is a 42 year old female, presenting with cough chest tightness and shortness of breath.  Symptoms are made worse by walking, talking and laughing, there may better by sitting or laying still. She runs and walks for exercise, she had some mild dyspnea about 5 days ago, the following day she broke up a fight between her brother's dogs. Since then her chest was tight.  She is not allergic to dogs as far as she knows. She is using her albuterol inhaler 2 puffs every 4 hours. She does not have a nebulizer. The patient is currently on a course of prednisone 40 mg daily (started yesterday), Singulair, Advair 100/50. She is not taking any antibiotics.  She has been on advair for several years for asthma. Her asthma was diagnosed in 2002 after an ED visit. Her last flare was about a year ago, she goes to the ED about once per year.  She takes no antihistamine. She denies reflux or heartburn. Has only  occasional sinus drainage.  She denies snoring or daytime sleepiness.   **Tracings personally reviewed, spirometry 01/26/2018; FVC is 114% predicted, FEV1 is 103% predicted, ratio 74%.  Flow volume loop appears normal.  Overall this test is consistent with normal lung functions.   PMHX:   Past Medical History:  Diagnosis Date  . Asthma   . Headache    Surgical Hx:  Past Surgical History:  Procedure Laterality Date  . OOPHORECTOMY  2005   right  . TUBAL LIGATION     left  . TUBAL LIGATION     left   Family Hx:  Family History  Problem Relation Age of Onset  . Migraines Father    Social Hx:   Social History   Tobacco Use  . Smoking status: Former Smoker    Types: Cigars  . Smokeless tobacco: Never Used  . Tobacco comment: not a daily smoker.  Substance Use Topics  . Alcohol use: No  . Drug use: Never   Medication:    Current Outpatient Medications:  .  albuterol (PROVENTIL HFA;VENTOLIN HFA) 108 (90 Base) MCG/ACT inhaler, Inhale 2 puffs into the lungs every 6 (six) hours as needed for wheezing or shortness of breath., Disp: 1 Inhaler, Rfl: 0 .  aspirin-acetaminophen-caffeine (EXCEDRIN MIGRAINE) 250-250-65 MG tablet, Take 1 tablet by mouth every 6 (six) hours as needed for headache., Disp: , Rfl:  .  Fluticasone-Salmeterol (ADVAIR) 100-50 MCG/DOSE AEPB, Inhale 2  puffs into the lungs 2 (two) times daily., Disp: , Rfl:  .  IBU 800 MG tablet, Take 1 tablet by mouth 2 (two) times daily as needed., Disp: , Rfl:  .  montelukast (SINGULAIR) 10 MG tablet, Take 10 mg by mouth at bedtime., Disp: , Rfl:  .  nortriptyline (PAMELOR) 25 MG capsule, TAKE 1 CAPSULE (25 MG TOTAL) BY MOUTH AT BEDTIME., Disp: 90 capsule, Rfl: 1 .  ondansetron (ZOFRAN) 4 MG tablet, Take 1 tablet (4 mg total) by mouth every 8 (eight) hours as needed for nausea or vomiting., Disp: 20 tablet, Rfl: 3 .  predniSONE (DELTASONE) 10 MG tablet, Take 40 mg by mouth daily., Disp: , Rfl:  .  rizatriptan (MAXALT-MLT) 10  MG disintegrating tablet, TAKE 1 TABLET (10 MG TOTAL) BY MOUTH AS NEEDED FOR MIGRAINE. MAY REPEAT IN 2 HOURS IF NEEDED, Disp: 9 tablet, Rfl: 0   Allergies:  Latex  Review of Systems: Gen:  Denies  fever, sweats, chills HEENT: Denies blurred vision, double vision. bleeds, sore throat Cvc:  No dizziness, chest pain. Resp:   Denies cough or sputum production, shortness of breath Gi: Denies swallowing difficulty, stomach pain. Gu:  Denies bladder incontinence, burning urine Ext:   No Joint pain, stiffness. Skin: No skin rash,  hives  Endoc:  No polyuria, polydipsia. Psych: No depression, insomnia. Other:  All other systems were reviewed with the patient and were negative other that what is mentioned in the HPI.   Physical Examination:   VS: BP 114/62 (BP Location: Left Arm, Cuff Size: Normal)   Pulse 83   Resp 16   Ht 5\' 5"  (1.651 m)   Wt 171 lb (77.6 kg)   SpO2 94%   BMI 28.46 kg/m   General Appearance: No distress  Neuro:without focal findings,  speech normal,  HEENT: PERRLA, EOM intact.   Pulmonary: normal breath sounds, No wheezing. Prolonged expiration.  CardiovascularNormal S1,S2.  No m/r/g.   Abdomen: Benign, Soft, non-tender. Renal:  No costovertebral tenderness  GU:  No performed at this time. Endoc: No evident thyromegaly, no signs of acromegaly. Skin:   warm, no rashes, no ecchymosis  Extremities: normal, no cyanosis, clubbing.  Other findings:    LABORATORY PANEL:   CBC No results for input(s): WBC, HGB, HCT, PLT in the last 168 hours. ------------------------------------------------------------------------------------------------------------------  Chemistries  No results for input(s): NA, K, CL, CO2, GLUCOSE, BUN, CREATININE, CALCIUM, MG, AST, ALT, ALKPHOS, BILITOT in the last 168 hours.  Invalid input(s): GFRCGP ------------------------------------------------------------------------------------------------------------------  Cardiac Enzymes No  results for input(s): TROPONINI in the last 168 hours. ------------------------------------------------------------  RADIOLOGY:  No results found.     Thank  you for the consultation and for allowing [facility] to assist in the care of your patient. Our recommendations are noted above.  Please contact us if we can be of further service.   Wells Guileseep Rayman Petrosian, MD.  Board Certified in Internal Medicine, Pulmonary Medicine, Critical Care Medicine, and Sleep Medicine.  [facility] Office Number: [phone]  Santiago Gladavid Kasa, M.D.  Billy Fischeravid Simonds, M.D  01/26/2018

## 2018-01-26 ENCOUNTER — Encounter: Payer: Self-pay | Admitting: Internal Medicine

## 2018-01-26 ENCOUNTER — Ambulatory Visit (INDEPENDENT_AMBULATORY_CARE_PROVIDER_SITE_OTHER): Payer: 59 | Admitting: Internal Medicine

## 2018-01-26 VITALS — BP 114/62 | HR 83 | Resp 16 | Ht 65.0 in | Wt 171.0 lb

## 2018-01-26 DIAGNOSIS — J45909 Unspecified asthma, uncomplicated: Secondary | ICD-10-CM | POA: Diagnosis not present

## 2018-01-26 DIAGNOSIS — R0609 Other forms of dyspnea: Secondary | ICD-10-CM

## 2018-01-26 DIAGNOSIS — J4551 Severe persistent asthma with (acute) exacerbation: Secondary | ICD-10-CM | POA: Diagnosis not present

## 2018-01-26 MED ORDER — FLUTICASONE-SALMETEROL 250-50 MCG/DOSE IN AEPB: 1.0000 | INHALATION_SPRAY | Freq: Two times a day (BID) | RESPIRATORY_TRACT | 5 refills | Status: DC

## 2018-01-26 MED ORDER — IPRATROPIUM-ALBUTEROL 0.5-2.5 (3) MG/3ML IN SOLN
3.0000 mL | Freq: Once | RESPIRATORY_TRACT | Status: AC
  Administered 2018-01-26: 3 mL via RESPIRATORY_TRACT

## 2018-01-26 NOTE — Patient Instructions (Addendum)
Will change advair to 250 twice daily. Stop advair 100. Remember to rinse your mouth after use.  Continue singulair indefinitely.

## 2018-01-26 NOTE — Addendum Note (Signed)
Addended by: Janean SarkSNIPES, Brice Kossman K on: 01/26/2018 10:06 AM   Modules accepted: Orders

## 2018-02-26 ENCOUNTER — Ambulatory Visit (INDEPENDENT_AMBULATORY_CARE_PROVIDER_SITE_OTHER): Payer: 59 | Admitting: Neurology

## 2018-02-26 ENCOUNTER — Encounter: Payer: Self-pay | Admitting: Neurology

## 2018-02-26 VITALS — BP 110/68 | HR 80 | Ht 65.0 in | Wt 184.0 lb

## 2018-02-26 DIAGNOSIS — G43009 Migraine without aura, not intractable, without status migrainosus: Secondary | ICD-10-CM

## 2018-02-26 NOTE — Patient Instructions (Signed)
Migraine Recommendations: 1.  Continue nortriptyline 25mg  at bedtime 2.  Take rizatriptan 10mg  at earliest onset of headache.  May repeat dose once in 2 hours if needed.  Do not exceed two tablets in 24 hours.  Use ondansetron for nausea as needed. 3.  Limit use of pain relievers to no more than 2 days out of the week.  These medications include acetaminophen, ibuprofen, triptans and narcotics.  This will help reduce risk of rebound headaches. 4.  Be aware of common food triggers such as processed sweets, processed foods with nitrites (such as deli meat, hot dogs, sausages), foods with MSG, alcohol (such as wine), chocolate, certain cheeses, certain fruits (dried fruits, bananas, some citrus fruit), vinegar, diet soda. 4.  Avoid caffeine 5.  Routine exercise 6.  Proper sleep hygiene 7.  Stay adequately hydrated with water 8.  Keep a headache diary. 9.  Maintain proper stress management. 10.  Do not skip meals. 11.  Consider supplements:  Magnesium citrate 400mg  to 600mg  daily, riboflavin 400mg , Coenzyme Q 10 100mg  three times daily

## 2018-02-26 NOTE — Progress Notes (Signed)
NEUROLOGY FOLLOW UP OFFICE NOTE  Norma Dunn [id number]  HISTORY OF PRESENT ILLNESS: Norma Dunn is a 42 year old female who follows up for migraines.   UPDATE: Improved. Intensity:  2-3/10 Duration:  Usually 1 to 2 hours Frequency:  1 or less headache days a month. Frequency of abortive medication:  1 or less days a month Rescue protocol:  Rizatriptan (repeats dose after 2 hours 10% of time) Current NSAIDS:  no Current analgesics:  no Current triptans:  rizatriptan 10mg  Current anti-emetic:  Zofran 4mg  Current muscle relaxants:  no Current anti-anxiolytic:  no Current sleep aide:  no Current Antihypertensive medications:  no Current Antidepressant medications:  nortriptyline 25mg  Current Anticonvulsant medications:  no Current Vitamins/Herbal/Supplements:  no Current Antihistamines/Decongestants:  no Other therapy:  no   Caffeine:  rarely Alcohol:  no Smoker:  no Diet:  Hydrates.  Rarely soda.  Watches diet. Exercise:  yes Depression/anxiety:  No Sleep hygiene:  Improved.   HISTORY:  Onset:  Since her 25s. Location:  Usually bi-frontal or either side Quality:  Pressure, sometimes sharp Initial Intensity:  Moderate to severe Aura:  no Prodrome:  no Postdrome:  Sometimes fatigue Associated symptoms:  Nausea, photophobia, phonophobia, blurred vision in left eye, no vomiting.  She has not had any new worse headache of her life, waking up from sleep Initial Duration:  Usually 2 days (but up to a week) Initial Frequency:  Varies.  Some months up to 18 days per month but she may have 1 or 2 months with little or no headache. Initial Frequency of abortive medication: as needed. Triggers/exacerbating factors:  Menstrual cycle, stress Relieving factors:  nothing Activity:  Aggravates.  Cannot function or go to work 5 days a month.   MRI of brain to evaluate headache from 09/21/04 revealed "borderline [place] [address] Chiari I malformation" (does not state distance of  distension below foramen magnum) and "mild fullness of the pituitary" but otherwise unremarkable.   Past NSAIDS:  Ibuprofen 800mg  Past analgesics:  Excedrin Past abortive triptans:  Sumatriptan (ineffective) Past muscle relaxants:  no Past anti-emetic:  Reglan (effective), Promethazine 25mg  (effective but causes drowsiness) Past antihypertensive medications:  no Past antidepressant medications:  no Past anticonvulsant medications:  no Past vitamins/Herbal/Supplements:  no Other past therapies:  no   Family history of headache:  Father (migraines)  PAST MEDICAL HISTORY: Past Medical History:  Diagnosis Date  . Asthma   . Headache     MEDICATIONS: Current Outpatient Medications on File Prior to Visit  Medication Sig Dispense Refill  . albuterol (PROVENTIL HFA;VENTOLIN HFA) 108 (90 Base) MCG/ACT inhaler Inhale 2 puffs into the lungs every 6 (six) hours as needed for wheezing or shortness of breath. 1 Inhaler 0  . aspirin-acetaminophen-caffeine (EXCEDRIN MIGRAINE) 250-250-65 MG tablet Take 1 tablet by mouth every 6 (six) hours as needed for headache.    . Fluticasone-Salmeterol (ADVAIR DISKUS) 250-50 MCG/DOSE AEPB Inhale 1 puff into the lungs 2 (two) times daily. 60 each 5  . IBU 800 MG tablet Take 1 tablet by mouth 2 (two) times daily as needed.    . montelukast (SINGULAIR) 10 MG tablet Take 10 mg by mouth at bedtime.    . nortriptyline (PAMELOR) 25 MG capsule TAKE 1 CAPSULE (25 MG TOTAL) BY MOUTH AT BEDTIME. 90 capsule 1  . ondansetron (ZOFRAN) 4 MG tablet Take 1 tablet (4 mg total) by mouth every 8 (eight) hours as needed for nausea or vomiting. 20 tablet 3  . predniSONE (DELTASONE) 10  MG tablet Take 40 mg by mouth daily.    . rizatriptan (MAXALT-MLT) 10 MG disintegrating tablet TAKE 1 TABLET (10 MG TOTAL) BY MOUTH AS NEEDED FOR MIGRAINE. MAY REPEAT IN 2 HOURS IF NEEDED 9 tablet 0   No current facility-administered medications on file prior to visit.     ALLERGIES: Allergies    Allergen Reactions  . Latex Itching    FAMILY HISTORY: Family History  Problem Relation Age of Onset  . Migraines Father     SOCIAL HISTORY: Social History   Socioeconomic History  . Marital status: Unknown    Spouse name: Not on file  . Number of children: 3  . Years of education: Not on file  . Highest education level: Some college, no degree  Occupational History  . Occupation: Surveyor, mineralspostal clerk    Employer: [employer]  Social Needs  . Financial resource strain: Not on file  . Food insecurity:    Worry: Not on file    Inability: Not on file  . Transportation needs:    Medical: Not on file    Non-medical: Not on file  Tobacco Use  . Smoking status: Former Smoker    Types: Cigars  . Smokeless tobacco: Never Used  . Tobacco comment: not a daily smoker.  Substance and Sexual Activity  . Alcohol use: No  . Drug use: Never  . Sexual activity: Not on file  Lifestyle  . Physical activity:    Days per week: Not on file    Minutes per session: Not on file  . Stress: Not on file  Relationships  . Social connections:    Talks on phone: Not on file    Gets together: Not on file    Attends religious service: Not on file    Active member of club or organization: Not on file    Attends meetings of clubs or organizations: Not on file    Relationship status: Not on file  . Intimate partner violence:    Fear of current or ex partner: Not on file    Emotionally abused: Not on file    Physically abused: Not on file    Forced sexual activity: Not on file  Other Topics Concern  . Not on file  Social History Narrative   Single, lives with her 3 children. Has been avoiding caffeine. Exercises 2-3 x week for 60 minutes or more.    REVIEW OF SYSTEMS: Constitutional: No fevers, chills, or sweats, no generalized fatigue, change in appetite Eyes: No visual changes, double vision, eye pain Ear, nose and throat: No hearing loss, ear pain, nasal congestion, sore throat Cardiovascular: No  chest pain, palpitations Respiratory:  No shortness of breath at rest or with exertion, wheezes GastrointestinaI: No nausea, vomiting, diarrhea, abdominal pain, fecal incontinence Genitourinary:  No dysuria, urinary retention or frequency Musculoskeletal:  No neck pain, back pain Integumentary: No rash, pruritus, skin lesions Neurological: as above Psychiatric: No depression, insomnia, anxiety Endocrine: No palpitations, fatigue, diaphoresis, mood swings, change in appetite, change in weight, increased thirst Hematologic/Lymphatic:  No purpura, petechiae. Allergic/Immunologic: no itchy/runny eyes, nasal congestion, recent allergic reactions, rashes  PHYSICAL EXAM: Vitals:   02/26/18 0801  BP: 110/68  Pulse: 80  SpO2: 98%   General: No acute distress.  Patient appears well-groomed.  Head:  Normocephalic/atraumatic Eyes:  Fundi examined but not visualized Neck: supple, no paraspinal tenderness, full range of motion Heart:  Regular rate and rhythm Lungs:  Clear to auscultation bilaterally Back: No paraspinal tenderness Neurological  Exam: alert and oriented to person, place, and time. Attention span and concentration intact, recent and remote memory intact, fund of knowledge intact.  Speech fluent and not dysarthric, language intact.  CN II-XII intact. Bulk and tone normal, muscle strength 5/5 throughout.  Sensation to light touch  intact.  Deep tendon reflexes 2+ throughout.  Finger to nose testing intact.  Gait normal, Romberg negative.  IMPRESSION: Migraine without aura, without status migrainosus, not intractable  PLAN: 1.  Nortriptyline 25mg  at bedtime for prophylaxis 2.  Rizatriptan 10mg  for abortive therapy 3.  Limit pain relievers to no more than 2 days out of week to prevent rebound headache 4.  Headache diary 5.  Follow up in 6 months.  Shon Millet, DO

## 2018-03-21 ENCOUNTER — Other Ambulatory Visit: Payer: Self-pay | Admitting: Neurology

## 2018-04-02 ENCOUNTER — Ambulatory Visit (INDEPENDENT_AMBULATORY_CARE_PROVIDER_SITE_OTHER): Payer: 59

## 2018-04-02 ENCOUNTER — Telehealth: Payer: Self-pay | Admitting: Neurology

## 2018-04-02 DIAGNOSIS — G43009 Migraine without aura, not intractable, without status migrainosus: Secondary | ICD-10-CM

## 2018-04-02 MED ORDER — DIPHENHYDRAMINE HCL 50 MG/ML IJ SOLN
25.0000 mg | Freq: Once | INTRAMUSCULAR | Status: AC
  Administered 2018-04-02: 25 mg via INTRAMUSCULAR

## 2018-04-02 MED ORDER — METOCLOPRAMIDE HCL 5 MG/ML IJ SOLN
10.0000 mg | Freq: Once | INTRAMUSCULAR | Status: AC
  Administered 2018-04-02: 10 mg via INTRAMUSCULAR

## 2018-04-02 MED ORDER — KETOROLAC TROMETHAMINE 60 MG/2ML IM SOLN
60.0000 mg | Freq: Once | INTRAMUSCULAR | Status: AC
  Administered 2018-04-02: 60 mg via INTRAMUSCULAR

## 2018-04-02 NOTE — Telephone Encounter (Signed)
Patient has had a migraine since last Tuesday and wants to know what she can do to help it

## 2018-04-02 NOTE — Telephone Encounter (Signed)
If she cannot come in for a shot, we can prescribe a prednisone taper to break an intractable headache.  It is taken for 6 days.

## 2018-04-02 NOTE — Telephone Encounter (Signed)
Called and spoke with Pt. She has used all the rizatriptan she can use, has been taking nortriptyline daily at bedtime. I offered her to come for an injection, she is not sure she has a driver, but is aware she can still come for a toradol injection. Pt wants to know if there is something else you recommend that could be called in.

## 2018-04-02 NOTE — Telephone Encounter (Signed)
Called and spoke with Pt. Advised her of prednisone option. She has since spoken with her sister-in-law, she will come for headache cocktail after 1:30p

## 2018-04-04 ENCOUNTER — Telehealth: Payer: Self-pay | Admitting: Neurology

## 2018-04-04 NOTE — Telephone Encounter (Signed)
Patient called stating that she needed some documentation paperwork showing where she had an injection on Monday for her migraine. Please call her back at [phone]. Thanks.

## 2018-04-05 NOTE — Telephone Encounter (Signed)
Pt needs letter faxed to her employer @ [phone], that she was seen and had an injection. Attention Thayer DallasSusan Burrell

## 2018-05-29 ENCOUNTER — Encounter: Payer: Self-pay | Admitting: Internal Medicine

## 2018-05-29 ENCOUNTER — Ambulatory Visit (INDEPENDENT_AMBULATORY_CARE_PROVIDER_SITE_OTHER): Payer: 59 | Admitting: Internal Medicine

## 2018-05-29 VITALS — BP 114/64 | HR 81 | Resp 16 | Ht 64.0 in | Wt 189.0 lb

## 2018-05-29 DIAGNOSIS — R0609 Other forms of dyspnea: Secondary | ICD-10-CM

## 2018-05-29 DIAGNOSIS — J4540 Moderate persistent asthma, uncomplicated: Secondary | ICD-10-CM

## 2018-05-29 MED ORDER — MONTELUKAST SODIUM 10 MG PO TABS: 10.0000 mg | ORAL_TABLET | Freq: Every day | ORAL | 11 refills | Status: DC

## 2018-05-29 NOTE — Patient Instructions (Signed)
Continue advair 100 1 puff twice daily, rinse mouth use.  Continue singulair.  Will give flu shot today.

## 2018-05-29 NOTE — Progress Notes (Signed)
[facility] [place] [facility] Consultation      Assessment and Plan:  Asthma.  --Moderate persistent asthma, with yearly exacerbations.  She felt that her breathing got worse with Advair 250 compared with Advair 100, she felt that also made her gain weight.  We will therefore maintain her on Advair 100 and Singulair. --Inhaler instruction given today, she has good inhaler technique. - Flu shot today. -She was asked to follow-up in 1 year, however she should call us back sooner if she feels worse.  She was also instructed on when to use rescue inhaler.  Meds ordered this encounter  Medications  . montelukast (SINGULAIR) 10 MG tablet    Sig: Take 1 tablet (10 mg total) by mouth at bedtime.    Dispense:  30 tablet    Refill:  11   Return in about 1 year (around 05/30/2019).     Date: 05/29/2018  MRN# [id number] Norma Dunn [birth year]  Norma Dunn is a 42 y.o. old female seen in consultation for chief complaint of:    Chief Complaint  Patient presents with  . Asthma    pt states Advair 250 too expensive and not effective ( increased sob). Pt has been using Advair 100 that she had left over.    HPI:  The patient is a 42 year old female, with a history of asthma.  At last visit she was having exacerbation, treated with a course of prednisone.  Her Advair was increased from 100/50 to Advair 250, she is asked to continue Singulair.  She noted that she has a yearly flareup of her asthma.  However she felt that she gained weight when she changed to the advair 250, and it made her breathing worse. She stopped it and went back to the advair 100, and she feels that she does better with that. She is not on singulair.  She has a dog, not in bed.  She has occasional reflux, controlled by diet.  She denies snoring or daytime sleepiness.   **Tracings personally reviewed, spirometry 01/26/2018; FVC is 114% predicted, FEV1 is 103% predicted, ratio 74%.  Flow volume loop appears normal.   Overall this test is consistent with normal lung functions.  Medication:    Current Outpatient Medications:  .  albuterol (PROVENTIL HFA;VENTOLIN HFA) 108 (90 Base) MCG/ACT inhaler, Inhale 2 puffs into the lungs every 6 (six) hours as needed for wheezing or shortness of breath., Disp: 1 Inhaler, Rfl: 0 .  aspirin-acetaminophen-caffeine (EXCEDRIN MIGRAINE) 250-250-65 MG tablet, Take 1 tablet by mouth every 6 (six) hours as needed for headache., Disp: , Rfl:  .  Fluticasone-Salmeterol (ADVAIR DISKUS) 250-50 MCG/DOSE AEPB, Inhale 1 puff into the lungs 2 (two) times daily., Disp: 60 each, Rfl: 5 .  IBU 800 MG tablet, Take 1 tablet by mouth 2 (two) times daily as needed., Disp: , Rfl:  .  montelukast (SINGULAIR) 10 MG tablet, Take 10 mg by mouth at bedtime., Disp: , Rfl:  .  nortriptyline (PAMELOR) 25 MG capsule, TAKE 1 CAPSULE (25 MG TOTAL) BY MOUTH AT BEDTIME., Disp: 90 capsule, Rfl: 1 .  ondansetron (ZOFRAN) 4 MG tablet, Take 1 tablet (4 mg total) by mouth every 8 (eight) hours as needed for nausea or vomiting., Disp: 20 tablet, Rfl: 3 .  predniSONE (DELTASONE) 10 MG tablet, Take 40 mg by mouth daily., Disp: , Rfl:  .  rizatriptan (MAXALT-MLT) 10 MG disintegrating tablet, TAKE 1 TABLET (10 MG TOTAL) BY MOUTH AS NEEDED FOR MIGRAINE. MAY REPEAT IN 2  HOURS IF NEEDED, Disp: 9 tablet, Rfl: 1   Allergies:  Latex  Review of Systems:  Constitutional: Feels well. Cardiovascular: Denies chest pain, exertional chest pain.  Pulmonary: Denies hemoptysis, pleuritic chest pain.   The remainder of systems were reviewed and were found to be negative other than what is documented in the HPI.    Physical Examination:   VS: BP 114/64 (BP Location: Left Arm, Cuff Size: Normal)   Pulse 81   Resp 16   Ht 5\' 4"  (1.626 m)   Wt 189 lb (85.7 kg)   SpO2 96%   BMI 32.44 kg/m   General Appearance: No distress  Neuro:without focal findings, mental status, speech normal, alert and oriented HEENT: PERRLA, EOM  intact Pulmonary: No wheezing, No rales  CardiovascularNormal S1,S2.  No m/r/g.  Abdomen: Benign, Soft, non-tender, No masses Renal:  No costovertebral tenderness  GU:  No performed at this time. Endoc: No evident thyromegaly, no signs of acromegaly or Cushing features Skin:   warm, no rashes, no ecchymosis  Extremities: normal, no cyanosis, clubbing.      LABORATORY PANEL:   CBC No results for input(s): WBC, HGB, HCT, PLT in the last 168 hours. ------------------------------------------------------------------------------------------------------------------  Chemistries  No results for input(s): NA, K, CL, CO2, GLUCOSE, BUN, CREATININE, CALCIUM, MG, AST, ALT, ALKPHOS, BILITOT in the last 168 hours.  Invalid input(s): GFRCGP ------------------------------------------------------------------------------------------------------------------  Cardiac Enzymes No results for input(s): TROPONINI in the last 168 hours. ------------------------------------------------------------  RADIOLOGY:  No results found.     Thank  you for the consultation and for allowing [facility] to assist in the care of your patient. Our recommendations are noted above.  Please contact us if we can be of further service.  Wells Guiles, M.D., F.C.C.P.  Board Certified in Internal Medicine, Pulmonary Medicine, Critical Care Medicine, and Sleep Medicine.  [facility] Office Number: [phone]   05/29/2018

## 2018-06-25 ENCOUNTER — Other Ambulatory Visit: Payer: Self-pay | Admitting: Neurology

## 2018-07-11 ENCOUNTER — Other Ambulatory Visit: Payer: Self-pay | Admitting: Neurology

## 2018-08-23 NOTE — Progress Notes (Deleted)
 NEUROLOGY FOLLOW UP OFFICE NOTE  Norma Dunn [id number]  HISTORY OF PRESENT ILLNESS: Norma Dunn is a 42-year-old female who follows up for migraines.  UPDATE: Intensity:  *** Duration:  *** Frequency:  *** Frequency of abortive medication: *** Rescue protocol: Rizatriptan (repeats dose after 2 hours 10% of the time) Current NSAIDS: None Current analgesics: None Current triptans: Rizatriptan 10 mg Current ergotamine: Zofran 4 mg Current anti-emetic: None Current muscle relaxants: None Current anti-anxiolytic: None Current sleep aide: None Current Antihypertensive medications: None Current Antidepressant medications: Nortriptyline 25 mg Current Anticonvulsant medications: None Current anti-CGRP: None Current Vitamins/Herbal/Supplements: None Current Antihistamines/Decongestants: None Other therapy: None Hormone/birth control: None  Caffeine: Rarely Alcohol: No Smoker: No Diet: Hydrates.  Rarely drinks soda.  Watches her diet. Exercise: Yes Depression: No; Anxiety: No Other pain: No Sleep hygiene: Okay  HISTORY:  Onset: Since her 20s Location:  Usually bi-frontal or either side Quality:  Pressure, sometimes sharp Initial Intensity:  Moderate to severe Aura:  no Prodrome:  no Postdrome:  Sometimes fatigue Associated symptoms: Nausea, photophobia, phonophobia, blurred vision in left eye, no vomiting.  She has not had any new worse headache of her life, waking up from sleep Initial Duration:  Usually 2 days (but up to a week) Initial Frequency:  Varies.  Some months up to 18 days per month but she may have 1 or 2 months with little or no headache. Initial Frequency of abortive medication: as needed. Triggers: Menstrual cycle, stress Relieving factors:  nothing Activity:  Aggravates.  Cannot function or go to work 5 days a month.  MRI of brain to evaluate headache from 09/21/04 revealed "borderline Arnold Chiari I malformation" (does not state distance of  distension below foramen magnum) and "mild fullness of the pituitary" but otherwise unremarkable.  Past NSAIDS:  Ibuprofen 800mg Past analgesics:  Excedrin Past abortive triptans:  Sumatriptan (ineffective) Past muscle relaxants:  no Past anti-emetic:  Reglan (effective), Promethazine 25mg (effective but causes drowsiness) Past antihypertensive medications:  no Past antidepressant medications:  no Past anticonvulsant medications:  no Past vitamins/Herbal/Supplements:  no Other past therapies:  no  Family history of headache:  Father (migraines)  PAST MEDICAL HISTORY: Past Medical History:  Diagnosis Date  . Asthma   . Headache     MEDICATIONS: Current Outpatient Medications on File Prior to Visit  Medication Sig Dispense Refill  . albuterol (PROVENTIL HFA;VENTOLIN HFA) 108 (90 Base) MCG/ACT inhaler Inhale 2 puffs into the lungs every 6 (six) hours as needed for wheezing or shortness of breath. 1 Inhaler 0  . aspirin-acetaminophen-caffeine (EXCEDRIN MIGRAINE) 250-250-65 MG tablet Take 1 tablet by mouth every 6 (six) hours as needed for headache.    . Fluticasone-Salmeterol (ADVAIR DISKUS) 100-50 MCG/DOSE AEPB Inhale 1 puff into the lungs 2 (two) times daily.    . IBU 800 MG tablet Take 1 tablet by mouth 2 (two) times daily as needed.    . montelukast (SINGULAIR) 10 MG tablet Take 1 tablet (10 mg total) by mouth at bedtime. 30 tablet 11  . nortriptyline (PAMELOR) 25 MG capsule TAKE 1 CAPSULE (25 MG TOTAL) BY MOUTH AT BEDTIME. 90 capsule 1  . ondansetron (ZOFRAN) 4 MG tablet Take 1 tablet (4 mg total) by mouth every 8 (eight) hours as needed for nausea or vomiting. 20 tablet 3  . rizatriptan (MAXALT-MLT) 10 MG disintegrating tablet TAKE 1 TABLET (10 MG TOTAL) BY MOUTH AS NEEDED FOR MIGRAINE. MAY REPEAT IN 2 HOURS IF NEEDED 9 tablet 1   No   current facility-administered medications on file prior to visit.     ALLERGIES: Allergies  Allergen Reactions  . Latex Itching    FAMILY  HISTORY: Family History  Problem Relation Age of Onset  . Migraines Father    ***.  SOCIAL HISTORY: Social History   Socioeconomic History  . Marital status: Unknown    Spouse name: Not on file  . Number of children: 3  . Years of education: Not on file  . Highest education level: Some college, no degree  Occupational History  . 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 Financial resource strain: Not on file  . Food insecurity:    Worry: Not on file    Inability: Not on file  . Transportation needs:    Medical: Not on file    Non-medical: Not on file  Tobacco Use  . Smoking status: Former Smoker    Types: Cigars  . Smokeless tobacco: Never Used  . Tobacco comment: not a daily smoker.  Substance and Sexual Activity  . Alcohol use: No  . Drug use: Never  . Sexual activity: Not on file  Lifestyle  . Physical activity:    Days per week: Not on file    Minutes per session: Not on file  . Stress: Not on file  Relationships  . Social connections:    Talks on phone: Not on file    Gets together: Not on file    Attends religious service: Not on file    Active member of club or organization: Not on file    Attends meetings of clubs or organizations: Not on file    Relationship status: Not on file  . Intimate partner violence:    Fear of current or ex partner: Not on file    Emotionally abused: Not on file    Physically abused: Not on file    Forced sexual activity: Not on file  Other Topics Concern  . Not on file  Social History Narrative   Single, lives with her 3 children. Has been avoiding caffeine. Exercises 2-3 x week for 60 minutes or more.    REVIEW OF SYSTEMS: Constitutional: No fevers, chills, or sweats, no generalized fatigue, change in appetite Eyes: No visual changes, double vision, eye pain Ear, nose and throat: No hearing loss, ear pain, nasal congestion, sore throat Cardiovascular: No chest pain, palpitations Respiratory:  No  shortness of breath at rest or with exertion, wheezes GastrointestinaI: No nausea, vomiting, diarrhea, abdominal pain, fecal incontinence Genitourinary:  No dysuria, urinary retention or frequency Musculoskeletal:  No neck pain, back pain Integumentary: No rash, pruritus, skin lesions Neurological: as above Psychiatric: No depression, insomnia, anxiety Endocrine: No palpitations, fatigue, diaphoresis, mood swings, change in appetite, change in weight, increased thirst Hematologic/Lymphatic:  No purpura, petechiae. Allergic/Immunologic: no itchy/runny eyes, nasal congestion, recent allergic reactions, rashes  PHYSICAL EXAM: *** General: No acute distress.  Patient appears ***-groomed.  *** body habitus. Head:  Normocephalic/atraumatic Eyes:  Fundi examined but not visualized Neck: supple, no paraspinal tenderness, full range of motion Heart:  Regular rate and rhythm Lungs:  Clear to auscultation bilaterally Back: No paraspinal tenderness Neurological Exam: alert and oriented to person, place, and time. Attention span and concentration intact, recent and remote memory intact, fund of knowledge intact.  Speech fluent and not dysarthric, language intact.  CN II-XII intact. Bulk and tone normal, muscle strength 5/5 throughout.  Sensation to light touch, temperature and vibration intact.  Deep tendon  reflexes 2+ throughout, toes downgoing.  Finger to nose and heel to shin testing intact.  Gait normal, Romberg negative.  IMPRESSION: ***  PLAN: ***  Shon Millet, DO  CC: ***

## 2018-08-27 ENCOUNTER — Ambulatory Visit: Payer: 59 | Admitting: Neurology

## 2018-08-27 ENCOUNTER — Telehealth: Payer: Self-pay | Admitting: Neurology

## 2018-08-27 NOTE — Telephone Encounter (Signed)
Called Pt and advised her she will need to wait until Dr. Everlena Cooper returns to sign the forms. I advised her to call her HR department and let them know the situation and that they can call our office for verification if needed.

## 2018-08-27 NOTE — Telephone Encounter (Signed)
Patient wants to check the status of the FMLA paper work that she dropped off last week please call her

## 2018-08-27 NOTE — Telephone Encounter (Signed)
Called and spoke with Pt. I advised her the forms have been completed though have not been signed. I explained to her Dr. Everlena Cooper has had a family emergency out of town and I will inquire with one of the other providers if they are able to sign, and I will call her back.

## 2018-09-11 ENCOUNTER — Telehealth: Payer: Self-pay | Admitting: Neurology

## 2018-09-11 NOTE — Telephone Encounter (Signed)
Patient wants to talk someone about the [facility] paper work

## 2018-09-11 NOTE — Telephone Encounter (Signed)
Called Pt, she wanted to let us know she will be by tomorrow to p/u papers

## 2018-09-18 NOTE — Progress Notes (Deleted)
NEUROLOGY FOLLOW UP OFFICE NOTE  Norma Dunn [id number]  HISTORY OF PRESENT ILLNESS: Norma Dunn is a 43 year old female who follows up for migraines.  UPDATE: Intensity:  *** Duration:  *** Frequency:  *** Frequency of abortive medication: *** Rescue protocol: Rizatriptan (repeats dose after 2 hours 10% of the time) Current NSAIDS: None Current analgesics: None Current triptans: Rizatriptan 10 mg Current ergotamine: Zofran 4 mg Current anti-emetic: None Current muscle relaxants: None Current anti-anxiolytic: None Current sleep aide: None Current Antihypertensive medications: None Current Antidepressant medications: Nortriptyline 25 mg Current Anticonvulsant medications: None Current anti-CGRP: None Current Vitamins/Herbal/Supplements: None Current Antihistamines/Decongestants: None Other therapy: None Hormone/birth control: None  Caffeine: Rarely Alcohol: No Smoker: No Diet: Hydrates.  Rarely drinks soda.  Watches her diet. Exercise: Yes Depression: No; Anxiety: No Other pain: No Sleep hygiene: Okay  HISTORY:  Onset: Since her 6920s Location:  Usually bi-frontal or either side Quality:  Pressure, sometimes sharp Initial Intensity:  Moderate to severe Aura:  no Prodrome:  no Postdrome:  Sometimes fatigue Associated symptoms: Nausea, photophobia, phonophobia, blurred vision in left eye, no vomiting.  She has not had any new worse headache of her life, waking up from sleep Initial Duration:  Usually 2 days (but up to a week) Initial Frequency:  Varies.  Some months up to 18 days per month but she may have 1 or 2 months with little or no headache. Initial Frequency of abortive medication: as needed. Triggers: Menstrual cycle, stress Relieving factors:  nothing Activity:  Aggravates.  Cannot function or go to work 5 days a month.  MRI of brain to evaluate headache from 09/21/04 revealed "borderline Debroah LoopArnold Chiari I malformation" (does not state distance of  distension below foramen magnum) and "mild fullness of the pituitary" but otherwise unremarkable.  Past NSAIDS:  Ibuprofen 800mg  Past analgesics:  Excedrin Past abortive triptans:  Sumatriptan (ineffective) Past muscle relaxants:  no Past anti-emetic:  Reglan (effective), Promethazine 25mg  (effective but causes drowsiness) Past antihypertensive medications:  no Past antidepressant medications:  no Past anticonvulsant medications:  no Past vitamins/Herbal/Supplements:  no Other past therapies:  no  Family history of headache:  Father (migraines)  PAST MEDICAL HISTORY: Past Medical History:  Diagnosis Date  . Asthma   . Headache     MEDICATIONS: Current Outpatient Medications on File Prior to Visit  Medication Sig Dispense Refill  . albuterol (PROVENTIL HFA;VENTOLIN HFA) 108 (90 Base) MCG/ACT inhaler Inhale 2 puffs into the lungs every 6 (six) hours as needed for wheezing or shortness of breath. 1 Inhaler 0  . aspirin-acetaminophen-caffeine (EXCEDRIN MIGRAINE) 250-250-65 MG tablet Take 1 tablet by mouth every 6 (six) hours as needed for headache.    . Fluticasone-Salmeterol (ADVAIR DISKUS) 100-50 MCG/DOSE AEPB Inhale 1 puff into the lungs 2 (two) times daily.    . IBU 800 MG tablet Take 1 tablet by mouth 2 (two) times daily as needed.    . montelukast (SINGULAIR) 10 MG tablet Take 1 tablet (10 mg total) by mouth at bedtime. 30 tablet 11  . nortriptyline (PAMELOR) 25 MG capsule TAKE 1 CAPSULE (25 MG TOTAL) BY MOUTH AT BEDTIME. 90 capsule 1  . ondansetron (ZOFRAN) 4 MG tablet Take 1 tablet (4 mg total) by mouth every 8 (eight) hours as needed for nausea or vomiting. 20 tablet 3  . rizatriptan (MAXALT-MLT) 10 MG disintegrating tablet TAKE 1 TABLET (10 MG TOTAL) BY MOUTH AS NEEDED FOR MIGRAINE. MAY REPEAT IN 2 HOURS IF NEEDED 9 tablet 1   No  current facility-administered medications on file prior to visit.     ALLERGIES: Allergies  Allergen Reactions  . Latex Itching    FAMILY  HISTORY: Family History  Problem Relation Age of Onset  . Migraines Father    ***.  SOCIAL HISTORY: Social History   Socioeconomic History  . Marital status: Unknown    Spouse name: Not on file  . Number of children: 3  . Years of education: Not on file  . Highest education level: Some college, no degree  Occupational History  . 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 Financial resource strain: Not on file  . Food insecurity:    Worry: Not on file    Inability: Not on file  . Transportation needs:    Medical: Not on file    Non-medical: Not on file  Tobacco Use  . Smoking status: Former Smoker    Types: Cigars  . Smokeless tobacco: Never Used  . Tobacco comment: not a daily smoker.  Substance and Sexual Activity  . Alcohol use: No  . Drug use: Never  . Sexual activity: Not on file  Lifestyle  . Physical activity:    Days per week: Not on file    Minutes per session: Not on file  . Stress: Not on file  Relationships  . Social connections:    Talks on phone: Not on file    Gets together: Not on file    Attends religious service: Not on file    Active member of club or organization: Not on file    Attends meetings of clubs or organizations: Not on file    Relationship status: Not on file  . Intimate partner violence:    Fear of current or ex partner: Not on file    Emotionally abused: Not on file    Physically abused: Not on file    Forced sexual activity: Not on file  Other Topics Concern  . Not on file  Social History Narrative   Single, lives with her 3 children. Has been avoiding caffeine. Exercises 2-3 x week for 60 minutes or more.    REVIEW OF SYSTEMS: Constitutional: No fevers, chills, or sweats, no generalized fatigue, change in appetite Eyes: No visual changes, double vision, eye pain Ear, nose and throat: No hearing loss, ear pain, nasal congestion, sore throat Cardiovascular: No chest pain, palpitations Respiratory:  No  shortness of breath at rest or with exertion, wheezes GastrointestinaI: No nausea, vomiting, diarrhea, abdominal pain, fecal incontinence Genitourinary:  No dysuria, urinary retention or frequency Musculoskeletal:  No neck pain, back pain Integumentary: No rash, pruritus, skin lesions Neurological: as above Psychiatric: No depression, insomnia, anxiety Endocrine: No palpitations, fatigue, diaphoresis, mood swings, change in appetite, change in weight, increased thirst Hematologic/Lymphatic:  No purpura, petechiae. Allergic/Immunologic: no itchy/runny eyes, nasal congestion, recent allergic reactions, rashes  PHYSICAL EXAM: *** General: No acute distress.  Patient appears ***-groomed.  *** body habitus. Head:  Normocephalic/atraumatic Eyes:  Fundi examined but not visualized Neck: supple, no paraspinal tenderness, full range of motion Heart:  Regular rate and rhythm Lungs:  Clear to auscultation bilaterally Back: No paraspinal tenderness Neurological Exam: alert and oriented to person, place, and time. Attention span and concentration intact, recent and remote memory intact, fund of knowledge intact.  Speech fluent and not dysarthric, language intact.  CN II-XII intact. Bulk and tone normal, muscle strength 5/5 throughout.  Sensation to light touch, temperature and vibration intact.  Deep tendon  reflexes 2+ throughout, toes downgoing.  Finger to nose and heel to shin testing intact.  Gait normal, Romberg negative.  IMPRESSION: ***  PLAN: ***  Shon Millet, DO  CC: ***

## 2018-09-20 ENCOUNTER — Ambulatory Visit: Payer: 59 | Admitting: Neurology

## 2018-11-21 ENCOUNTER — Encounter: Payer: Self-pay | Admitting: Emergency Medicine

## 2018-11-21 ENCOUNTER — Emergency Department
Admission: EM | Admit: 2018-11-21 | Discharge: 2018-11-21 | Disposition: A | Discharge status: Home / Self Care | Payer: Federal, State, Local not specified - PPO | Attending: Emergency Medicine | Admitting: Emergency Medicine

## 2018-11-21 ENCOUNTER — Other Ambulatory Visit: Payer: Self-pay

## 2018-11-21 DIAGNOSIS — R51 Headache: Secondary | ICD-10-CM | POA: Diagnosis present

## 2018-11-21 DIAGNOSIS — G43909 Migraine, unspecified, not intractable, without status migrainosus: Secondary | ICD-10-CM | POA: Diagnosis not present

## 2018-11-21 DIAGNOSIS — Z87891 Personal history of nicotine dependence: Secondary | ICD-10-CM | POA: Insufficient documentation

## 2018-11-21 MED ORDER — SODIUM CHLORIDE 0.9 % IV SOLN
Freq: Once | INTRAVENOUS | Status: AC
  Administered 2018-11-21: 08:00:00 via INTRAVENOUS

## 2018-11-21 MED ORDER — METOCLOPRAMIDE HCL 5 MG/ML IJ SOLN
10.0000 mg | Freq: Once | INTRAMUSCULAR | Status: AC
  Administered 2018-11-21: 10 mg via INTRAVENOUS
  Filled 2018-11-21: qty 2

## 2018-11-21 MED ORDER — KETOROLAC TROMETHAMINE 30 MG/ML IJ SOLN
30.0000 mg | Freq: Once | INTRAMUSCULAR | Status: AC
Start: 2018-11-21 — End: 2018-11-21
  Administered 2018-11-21: 30 mg via INTRAVENOUS
  Filled 2018-11-21: qty 1

## 2018-11-21 MED ORDER — DIPHENHYDRAMINE HCL 50 MG/ML IJ SOLN
25.0000 mg | Freq: Once | INTRAMUSCULAR | Status: AC
  Administered 2018-11-21: 25 mg via INTRAVENOUS
  Filled 2018-11-21: qty 1

## 2018-11-21 MED ORDER — BUTALBITAL-APAP-CAFFEINE 50-325-40 MG PO TABS: 1.0000 | ORAL_TABLET | Freq: Four times a day (QID) | ORAL | 0 refills | Status: AC | PRN

## 2018-11-21 NOTE — ED Provider Notes (Signed)
[facility] Emergency Department Provider Note       Time seen: ----------------------------------------- 8:09 AM on 11/21/2018 -----------------------------------------   I have reviewed the triage vital signs and the nursing notes.  HISTORY   Chief Complaint Migraine    HPI Norma Dunn is a 43 y.o. female with a history of asthma and migraine headaches who presents to the ED for persistent headache.  Patient states she is had a migraine headache since last Tuesday that went away for the weekend but came back Sunday evening.  She is taking rizatriptan for migraine without any relief.  Past Medical History:  Diagnosis Date  . Asthma   . Headache     There are no active problems to display for this patient.   Past Surgical History:  Procedure Laterality Date  . OOPHORECTOMY  2005   right  . TUBAL LIGATION     left  . TUBAL LIGATION     left    Allergies Latex  Social History Social History   Tobacco Use  . Smoking status: Former Smoker    Types: Cigars  . Smokeless tobacco: Never Used  . Tobacco comment: not a daily smoker.  Substance Use Topics  . Alcohol use: No  . Drug use: Never   Review of Systems Constitutional: Negative for fever. Cardiovascular: Negative for chest pain. Respiratory: Negative for shortness of breath. Gastrointestinal: Negative for abdominal pain, vomiting and diarrhea. Musculoskeletal: Negative for back pain. Skin: Negative for rash. Neurological: Positive for headache  All systems negative/normal/unremarkable except as stated in the HPI  ____________________________________________   PHYSICAL EXAM:  VITAL SIGNS: ED Triage Vitals  Enc Vitals Group     BP 11/21/18 0755 127/86     Pulse Rate 11/21/18 0755 77     Resp 11/21/18 0755 18     Temp 11/21/18 0755 98.2 F (36.8 C)     Temp Source 11/21/18 0755 Oral     SpO2 11/21/18 0755 99 %     Weight 11/21/18 0752 184 lb (83.5 kg)     Height  11/21/18 0752 5\' 5"  (1.651 m)     Head Circumference --      Peak Flow --      Pain Score 11/21/18 0751 8     Pain Loc --      Pain Edu? --      Excl. in GC? --    Constitutional: Alert and oriented. Well appearing and in no distress. Eyes: Conjunctivae are normal. Normal extraocular movements.  Mild photophobia ENT      Head: Normocephalic and atraumatic.      Nose: No congestion/rhinnorhea.      Mouth/Throat: Mucous membranes are moist.      Neck: No stridor. Cardiovascular: Normal rate, regular rhythm. No murmurs, rubs, or gallops. Respiratory: Normal respiratory effort without tachypnea nor retractions. Breath sounds are clear and equal bilaterally. No wheezes/rales/rhonchi. Gastrointestinal: Soft and nontender. Normal bowel sounds Musculoskeletal: Nontender with normal range of motion in extremities. No lower extremity tenderness nor edema. Neurologic:  Normal speech and language. No gross focal neurologic deficits are appreciated.  Skin:  Skin is warm, dry and intact. No rash noted. Psychiatric: Mood and affect are normal. Speech and behavior are normal.  ____________________________________________  ED COURSE:  As part of my medical decision making, I reviewed the following data within the electronic MEDICAL RECORD NUMBER History obtained from family if available, nursing notes, old chart and ekg, as well as notes from prior ED visits.  Patient presented for migraine headache, we will assess with labs and imaging as indicated at this time.   Procedures Norma Dunn was evaluated in Emergency Department on 11/21/2018 for the symptoms described in the history of present illness. She was evaluated in the context of the global COVID-19 pandemic, which necessitated consideration that the patient might be at risk for infection with the SARS-CoV-2 virus that causes COVID-19. Institutional protocols and algorithms that pertain to the evaluation of patients at risk for COVID-19 are in a state  of rapid change based on information released by regulatory bodies including the CDC and federal and state organizations. These policies and algorithms were followed during the patient's care in the ED.   ____________________________________________   DIFFERENTIAL DIAGNOSIS   Migraine, tension headache, dehydration,  FINAL ASSESSMENT AND PLAN  Migraine headache   Plan: The patient had presented for persistent migraine headache.  She was given IV headache cocktail with symptom improvement.  She is cleared for outpatient follow-up with her doctor.   Ulice Dash, MD    Note: This note was generated in part or whole with voice recognition software. Voice recognition is usually quite accurate but there are transcription errors that can and very often do occur. I apologize for any typographical errors that were not detected and corrected.     Emily Filbert, MD 11/21/18 [phone]

## 2018-11-21 NOTE — ED Triage Notes (Signed)
Pt presents to ED via POV with c/o migraine. Pt states migraine since last Tuesday, "went away for the weekend, but came back Sunday evening". Pt endorses taking Motrin and Rizotriptan for migraine without relief. Pt endorses photophobia at this time.

## 2018-11-21 NOTE — ED Notes (Signed)
EDP William at bedside.

## 2018-11-21 NOTE — ED Notes (Signed)
Pt denies N/V at this time.  

## 2018-12-04 ENCOUNTER — Other Ambulatory Visit: Payer: Self-pay | Admitting: Neurology

## 2018-12-24 NOTE — Progress Notes (Signed)
Virtual Visit via Video Note The purpose of this virtual visit is to provide medical care while limiting exposure to the novel coronavirus.    Consent was obtained for video visit:  Yes.   Answered questions that patient had about telehealth interaction:  Yes.   I discussed the limitations, risks, security and privacy concerns of performing an evaluation and management service by telemedicine. I also discussed with the patient that there may be a patient responsible charge related to this service. The patient expressed understanding and agreed to proceed.  Pt location: Home Physician Location: office Name of referring provider:  [facility] I connected with [name] at patients initiation/request on 12/25/2018 at 11:00 AM EDT by video enabled telemedicine application and verified that I am speaking with the correct person using two identifiers. Pt MRN:  [id number] Pt DOB:  23-Jul-1976 Video Participants:  [name]   History of Present Illness:  Norma Dunn is a 43 year old woman who follows up for migraines.  UPDATE: Overall, she is doing well.  At the end of March, she had a severe intractable migraine that lasted about 10 days.  She was evaluated in the ED on 11/21/18 for persistent headache in which she received an IV headache cocktail.  Otherwise, she has not had a migraine over the past 3 months.  Rescue protocol:  Rizatriptan (repeats dose after 2 hours 10% of time) Current NSAIDS:  no Current analgesics:  no Current triptans:  rizatriptan 10mg  Current anti-emetic:  Zofran 4mg  Current muscle relaxants:  no Current anti-anxiolytic:  no Current sleep aide:  no Current Antihypertensive medications:  no Current Antidepressant medications:  nortriptyline 25mg  Current Anticonvulsant medications:  no Current Vitamins/Herbal/Supplements:  no Current Antihistamines/Decongestants:  no Other therapy:  no   Caffeine:  rarely Alcohol:  no Smoker:  no Diet:   Hydrates.  Rarely soda.  Watches diet. Exercise:  yes Depression/anxiety:  No Sleep hygiene:  Improved.  HISTORY: Onset:  Since her 35s Location:  Usually bi-frontal or either side Quality:  Pressure, sometimes sharp Initial Intensity:  Moderate to severe Aura:  no Prodrome:  no Postdrome:  Sometimes fatigue Associated symptoms:  Nausea, photophobia, phonophobia, blurred vision in left eye, no vomiting.  She has not had any new worse headache of her life, waking up from sleep Initial Duration:  Usually 2 days (but up to a week) Initial Frequency:  Varies.  Some months up to 18 days per month but she may have 1 or 2 months with little or no headache. Initial Frequency of abortive medication: as needed. Triggers:  Menstrual cycle, stress Relieving factors:  Nothing Activity:  Aggravates.  Cannot function or go to work 5 days a month.  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  Past NSAIDS:  Ibuprofen 800mg  Past analgesics:  Excedrin, Fioricet Past abortive triptans:  Sumatriptan (ineffective) Past muscle relaxants:  no Past anti-emetic:  Reglan (effective), Promethazine 25mg  (effective but causes drowsiness) Past antihypertensive medications:  no Past antidepressant medications:  no Past anticonvulsant medications:  no Past vitamins/Herbal/Supplements:  no Other past therapies:  no  Family history of headache:  Father (migraines)  Past Medical History: Past Medical History:  Diagnosis Date  . Asthma   . Headache     Medications: Outpatient Encounter Medications as of 12/25/2018  Medication Sig  . albuterol (PROVENTIL  HFA;VENTOLIN HFA) 108 (90 Base) MCG/ACT inhaler Inhale 2 puffs into the lungs every 6 (six) hours as needed for wheezing or shortness of breath.  Marland Kitchen  aspirin-acetaminophen-caffeine (EXCEDRIN MIGRAINE) 250-250-65 MG tablet Take 1 tablet by mouth every 6 (six) hours as needed for headache.  . butalbital-acetaminophen-caffeine (FIORICET, ESGIC) 50-325-40 MG tablet Take 1-2 tablets by mouth every 6 (six) hours as needed.  . Fluticasone-Salmeterol (ADVAIR DISKUS) 100-50 MCG/DOSE AEPB Inhale 1 puff into the lungs 2 (two) times daily.  . IBU 800 MG tablet Take 1 tablet by mouth 2 (two) times daily as needed.  . montelukast (SINGULAIR) 10 MG tablet Take 1 tablet (10 mg total) by mouth at bedtime.  . nortriptyline (PAMELOR) 25 MG capsule TAKE 1 CAPSULE (25 MG TOTAL) BY MOUTH AT BEDTIME.  Marland Kitchen ondansetron (ZOFRAN) 4 MG tablet Take 1 tablet (4 mg total) by mouth every 8 (eight) hours as needed for nausea or vomiting.  . rizatriptan (MAXALT-MLT) 10 MG disintegrating tablet TAKE 1 TABLET (10 MG TOTAL) BY MOUTH AS NEEDED FOR MIGRAINE. MAY REPEAT IN 2 HOURS IF NEEDED   No facility-administered encounter medications on file as of 12/25/2018.     Allergies: Allergies  Allergen Reactions  . Latex Itching    Family History: Family History  Problem Relation Age of Onset  . Migraines Father     Social History: Social History   Socioeconomic History  . Marital status: Single    Spouse name: Not on file  . Number of children: 3  . Years of education: Not on file  . Highest education level: Some college, no degree  Occupational History  . 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 Financial resource strain: Not on file  . Food insecurity:    Worry: Not on file    Inability: Not on file  . Transportation needs:    Medical: Not on file    Non-medical: Not on file  Tobacco Use  . Smoking status: Former Smoker    Types: Cigars  . Smokeless tobacco: Never Used  . Tobacco comment: not a daily smoker.  Substance and Sexual Activity  . Alcohol use: No  . Drug use: Never  . Sexual activity: Not on file  Lifestyle  . Physical  activity:    Days per week: Not on file    Minutes per session: Not on file  . Stress: Not on file  Relationships  . Social connections:    Talks on phone: Not on file    Gets together: Not on file    Attends religious service: Not on file    Active member of club or organization: Not on file    Attends meetings of clubs or organizations: Not on file    Relationship status: Not on file  . Intimate partner violence:    Fear of current or ex partner: Not on file    Emotionally abused: Not on file    Physically abused: Not on file    Forced sexual activity: Not on file  Other Topics Concern  . Not on file  Social History Narrative   Single, lives with her 3 children. Has been avoiding caffeine. Exercises 2-3 x week for 60 minutes or more.   Observations/Objective:   There were no vitals filed for this visit. Alert and oriented.  Speech fluent and not dysarthric.  Language intact.  Eyes orthophoric on primary gaze.  Face symmetric.   Assessment and Plan:   Migraine without  aura, without status migrainosus, not intractable  1.  For preventative management, refilled nortriptyline 25mg  at bedtime 2.  For abortive therapy, rizatriptan 10mg  3.  Limit use of pain relievers to no more than 2 days out of week to prevent risk of rebound or medication-overuse headache. 4.  Keep headache diary 5.  Exercise, hydration, caffeine cessation, sleep hygiene, monitor for and avoid triggers 6.  Consider:  magnesium citrate 400mg  daily, riboflavin 400mg  daily, and coenzyme Q10 100mg  three times daily 7. Always keep in mind that currently taking a hormone or birth control may be a possible trigger or aggravating factor for migraine. 8. Follow up in 6 months.   Follow Up Instructions:    -I discussed the assessment and treatment plan with the patient. The patient was provided an opportunity to ask questions and all were answered. The patient agreed with the plan and demonstrated an understanding of  the instructions.   The patient was advised to call back or seek an in-person evaluation if the symptoms worsen or if the condition fails to improve as anticipated.  Cira ServantAdam Robert Jaffe, DO

## 2018-12-25 ENCOUNTER — Encounter: Payer: Self-pay | Admitting: Neurology

## 2018-12-25 ENCOUNTER — Other Ambulatory Visit: Payer: Self-pay

## 2018-12-25 ENCOUNTER — Telehealth (INDEPENDENT_AMBULATORY_CARE_PROVIDER_SITE_OTHER): Payer: Self-pay | Admitting: Neurology

## 2018-12-25 VITALS — Temp 98.0°F | Ht 65.0 in | Wt 185.0 lb

## 2018-12-25 DIAGNOSIS — G43009 Migraine without aura, not intractable, without status migrainosus: Secondary | ICD-10-CM

## 2018-12-25 MED ORDER — NORTRIPTYLINE HCL 25 MG PO CAPS: 25.0000 mg | ORAL_CAPSULE | Freq: Every day | ORAL | 1 refills | Status: DC

## 2018-12-27 ENCOUNTER — Ambulatory Visit: Payer: 59 | Admitting: Neurology

## 2019-02-28 ENCOUNTER — Telehealth: Payer: Self-pay | Admitting: Neurology

## 2019-02-28 ENCOUNTER — Ambulatory Visit (INDEPENDENT_AMBULATORY_CARE_PROVIDER_SITE_OTHER): Payer: Federal, State, Local not specified - PPO

## 2019-02-28 ENCOUNTER — Other Ambulatory Visit: Payer: Self-pay

## 2019-02-28 DIAGNOSIS — G43019 Migraine without aura, intractable, without status migrainosus: Secondary | ICD-10-CM | POA: Diagnosis not present

## 2019-02-28 MED ORDER — METOCLOPRAMIDE HCL 5 MG/ML IJ SOLN
10.0000 mg | Freq: Once | INTRAVENOUS | Status: AC
  Administered 2019-02-28: 12:00:00 10 mg via INTRAMUSCULAR

## 2019-02-28 MED ORDER — DIPHENHYDRAMINE HCL 50 MG/ML IJ SOLN
25.0000 mg | Freq: Once | INTRAMUSCULAR | Status: AC
  Administered 2019-02-28: 11:00:00 25 mg via INTRAMUSCULAR

## 2019-02-28 MED ORDER — KETOROLAC TROMETHAMINE 60 MG/2ML IM SOLN
60.0000 mg | Freq: Once | INTRAMUSCULAR | Status: AC
  Administered 2019-02-28: 12:00:00 60 mg via INTRAMUSCULAR

## 2019-02-28 NOTE — Progress Notes (Signed)
Headache Cocktail- Patient came in office, with a driver. She was given an injection in right upper outer quadrant of glut.  Patient tolerated the injection well, with no apparent complications.  She was advised to rest and hydrate for the remainder of the day.

## 2019-02-28 NOTE — Telephone Encounter (Signed)
Called and spoke with Pt. She has been taking nortriptyline as directed QHS, has used rizatriptan without relief. Pt coming for headache cocktail. Confirmed she has a driver. Pt has had headache cocktail previously.

## 2019-02-28 NOTE — Telephone Encounter (Signed)
Patient is wanting a headache cocktail since she has had a headache for 4 days. Please call her back at [phone]. Thanks!

## 2019-03-04 ENCOUNTER — Telehealth: Payer: Self-pay | Admitting: Neurology

## 2019-03-04 NOTE — Telephone Encounter (Signed)
Patient left msg with after hours stating she is needing nurse sandi to fax note stating she was seen here on Thursday. Please fax to [phone]. Thanks!

## 2019-03-04 NOTE — Telephone Encounter (Signed)
Have already faxed letter

## 2019-03-04 NOTE — Telephone Encounter (Signed)
Sent fax through [employer]. Called Pt to find out where the fax was being sent to.

## 2019-03-04 NOTE — Telephone Encounter (Signed)
Patient left msg with after hours about needing a letter for her employers. It will go to Shawnee Knapp at [employer]. She provided no more info than that. Thanks!

## 2019-03-19 ENCOUNTER — Other Ambulatory Visit: Payer: Self-pay | Admitting: Family Medicine

## 2019-03-19 DIAGNOSIS — Z1231 Encounter for screening mammogram for malignant neoplasm of breast: Secondary | ICD-10-CM

## 2019-04-02 ENCOUNTER — Other Ambulatory Visit: Payer: Self-pay | Admitting: Neurology

## 2019-04-13 ENCOUNTER — Other Ambulatory Visit: Payer: Self-pay | Admitting: Internal Medicine

## 2019-04-26 ENCOUNTER — Ambulatory Visit
Admission: RE | Admit: 2019-04-26 | Discharge: 2019-04-26 | Disposition: A | Discharge status: Home / Self Care | Payer: Federal, State, Local not specified - PPO | Source: Ambulatory Visit | Attending: Family Medicine | Admitting: Family Medicine

## 2019-04-26 DIAGNOSIS — Z1231 Encounter for screening mammogram for malignant neoplasm of breast: Secondary | ICD-10-CM

## 2019-05-06 ENCOUNTER — Telehealth: Payer: Self-pay | Admitting: Neurology

## 2019-05-06 DIAGNOSIS — G43019 Migraine without aura, intractable, without status migrainosus: Secondary | ICD-10-CM

## 2019-05-06 NOTE — Telephone Encounter (Signed)
Pt left message on VM that she is having blurred vision and had a migraine since Monday (labor Day ). She states that ppl are saying her face is swollen as well please call

## 2019-05-07 ENCOUNTER — Telehealth: Payer: Self-pay | Admitting: Neurology

## 2019-05-07 MED ORDER — PREDNISONE 10 MG (21) PO TBPK: ORAL_TABLET | ORAL | 0 refills | Status: DC

## 2019-05-07 NOTE — Telephone Encounter (Signed)
We can prescribe her a prednisone taper, 10mg  tablet:  Take 60mg  on day 1, then 50mg  on day 2, then 40mg  on day 3, then 30mg  on day 4, then 20mg  on day 5, then 10mg  on day 6, then STOP.  #21, Refills 0

## 2019-05-07 NOTE — Telephone Encounter (Signed)
Spoke to patient and she has had migraine since labor day. Has been dizzy and has had blurry vision in both eyes. Pain is 7/10 mostly left side of head. Others feel her left eye looks swollen.   Has taken Maxalt last yesterday. Probably has had 5 in past week. Is taking the nortriptyline as prescribed.  Patient wanted MD to know and to see what she can do for migraine.

## 2019-05-07 NOTE — Telephone Encounter (Signed)
Patient has some more questions for Manuela Schwartz about her medication

## 2019-05-07 NOTE — Telephone Encounter (Signed)
Prednisone dose pack ordered by MD  You will take all 6 tablets the 1st day, all 5 tablets the 2nd day, all 4 tablets the 3rd day, all three tablets the 4th day, both tablets the 5th day, and the final tablet on the 6th day. Remember to not take any additional NSAIDS while taking the prednisone. If it upsets your stomach, may take OTC Pepcid.   Informed patient above info (left on machine) and will send in (done)  to pharmacy [facility]. Asked patient to call back if any questions.

## 2019-05-09 NOTE — Telephone Encounter (Signed)
Patient is calling in about questions concerning her medication. She wants to find out what she can do and not do with medication. Thanks!

## 2019-05-10 ENCOUNTER — Ambulatory Visit (INDEPENDENT_AMBULATORY_CARE_PROVIDER_SITE_OTHER): Payer: Federal, State, Local not specified - PPO | Admitting: *Deleted

## 2019-05-10 ENCOUNTER — Other Ambulatory Visit: Payer: Self-pay

## 2019-05-10 ENCOUNTER — Encounter: Payer: Self-pay | Admitting: Neurology

## 2019-05-10 DIAGNOSIS — G43009 Migraine without aura, not intractable, without status migrainosus: Secondary | ICD-10-CM

## 2019-05-10 DIAGNOSIS — G43019 Migraine without aura, intractable, without status migrainosus: Secondary | ICD-10-CM | POA: Diagnosis not present

## 2019-05-10 MED ORDER — KETOROLAC TROMETHAMINE 60 MG/2ML IM SOLN
60.0000 mg | Freq: Once | INTRAMUSCULAR | Status: AC
  Administered 2019-05-10: 60 mg via INTRAMUSCULAR

## 2019-05-10 MED ORDER — DIPHENHYDRAMINE HCL 50 MG/ML IJ SOLN
25.0000 mg | Freq: Once | INTRAMUSCULAR | Status: AC
  Administered 2019-05-10: 25 mg via INTRAMUSCULAR

## 2019-05-10 MED ORDER — METOCLOPRAMIDE HCL 5 MG/ML IJ SOLN
10.0000 mg | Freq: Once | INTRAMUSCULAR | Status: AC
  Administered 2019-05-10: 10 mg via INTRAMUSCULAR

## 2019-05-10 NOTE — Progress Notes (Addendum)
Patient came for migraine "cocktail" order per Dr. Tomi Likens. Patient has had one before- no allergies to meds. Has a driver home - administered Benadryl 25 mg/ Toradol 60 mg/ Reglan 10. Pt tolerated well.   Patient started pred dose pack Tuesday and informed NOT to take any more as she is getting this injection today. Patient and mom aware of this.

## 2019-05-10 NOTE — Telephone Encounter (Signed)
Patient came into office to ask if she can take the maxalt for migraine and / or the meclizine she has for the dizziness.  Pred taper started Tuesday with no improvement.   MD made aware. Patient here in office and will take migraine cocktail that MD ordered.

## 2019-06-24 ENCOUNTER — Other Ambulatory Visit: Payer: Self-pay | Admitting: Neurology

## 2019-06-24 NOTE — Telephone Encounter (Signed)
Requested Prescriptions   Pending Prescriptions Disp Refills  . nortriptyline (PAMELOR) 25 MG capsule [Pharmacy Med Name: NORTRIPTYLINE HCL 25 MG CAP] 90 capsule 1    Sig: TAKE 1 CAPSULE (25 MG TOTAL) BY MOUTH AT BEDTIME.   Rx last filled: 12/25/18 #90 1 refills  Pt last seen:12/25/18  Follow up appt scheduled:07/01/19

## 2019-06-27 ENCOUNTER — Ambulatory Visit: Payer: Federal, State, Local not specified - PPO | Admitting: Pulmonary Disease

## 2019-06-27 ENCOUNTER — Encounter: Payer: Self-pay | Admitting: Pulmonary Disease

## 2019-06-27 ENCOUNTER — Other Ambulatory Visit: Payer: Self-pay

## 2019-06-27 VITALS — BP 106/74 | HR 77 | Temp 97.9°F | Ht 65.0 in | Wt 191.0 lb

## 2019-06-27 DIAGNOSIS — J4520 Mild intermittent asthma, uncomplicated: Secondary | ICD-10-CM

## 2019-06-27 MED ORDER — MONTELUKAST SODIUM 10 MG PO TABS: 10.0000 mg | ORAL_TABLET | Freq: Every day | ORAL | 11 refills | Status: DC

## 2019-06-27 MED ORDER — ALBUTEROL SULFATE HFA 108 (90 BASE) MCG/ACT IN AERS: 2.0000 | INHALATION_SPRAY | Freq: Four times a day (QID) | RESPIRATORY_TRACT | 6 refills | Status: DC | PRN

## 2019-06-27 NOTE — Progress Notes (Signed)
 Assessment & Plan:  1. Mild intermittent asthma without complication (Primary)   Patient Instructions  1.  Your prescriptions have been refilled.  This will be for Singulair  and albuterol  (Ventolin ).  2.  We will see you in 6 months time call sooner should any new difficulties arise.  Please note: late entry documentation due to logistical difficulties during COVID-19 pandemic. This note is filed for information purposes only, and is not intended to be used for billing, nor does it represent the full scope/nature of the visit in question. Please see any associated scanned media linked to date of encounter for additional pertinent information.  Subjective:    HPI: Norma Dunn is a 43 y.o. female presenting to the pulmonology clinic on 06/27/2019 with report of: Follow-up (Former patient of Dr Verdia. Breathing is doing well and she is not having any problems. She has been off the advair for several months and has not needed her albuterol . )   Patient is a 43 year old female, remote former smoker (quit 1999) who presents for follow-up of asthma.  She is a prior patient of Dr. Verdia who has left the practice.  She is currently on Singulair  and as needed albuterol  and feels that this keeps her control.  Previously she had been on Advair however did not tolerate this medication well and discontinued use on her own.  She has not had any recent exacerbations.  She works at the [employer] and uses appropriate face covering at work.  She also tries to maintain social distancing.  She is not up-to-date on flu vaccine.  She has not had any fevers, chills or sweats.  No chest pain.  No dyspnea on exertion.  She feels well and looks well.  She will need refills on Singulair  and albuterol .  Continues to decline maintenance inhaler.  Outpatient Encounter Medications as of 06/27/2019  Medication Sig   [EXPIRED] butalbital -acetaminophen-caffeine  (FIORICET, ESGIC) 50-325-40 MG tablet  Take 1-2 tablets by mouth every 6 (six) hours as needed.   IBU 800 MG tablet Take 1 tablet by mouth 2 (two) times daily as needed.   [DISCONTINUED] albuterol  (PROVENTIL  HFA;VENTOLIN  HFA) 108 (90 Base) MCG/ACT inhaler Inhale 2 puffs into the lungs every 6 (six) hours as needed for wheezing or shortness of breath.   [DISCONTINUED] montelukast  (SINGULAIR ) 10 MG tablet TAKE 1 TABLET BY MOUTH EVERYDAY AT BEDTIME   [DISCONTINUED] nortriptyline  (PAMELOR ) 25 MG capsule TAKE 1 CAPSULE (25 MG TOTAL) BY MOUTH AT BEDTIME.   [DISCONTINUED] ondansetron  (ZOFRAN ) 4 MG tablet Take 1 tablet (4 mg total) by mouth every 8 (eight) hours as needed for nausea or vomiting.   [DISCONTINUED] rizatriptan  (MAXALT -MLT) 10 MG disintegrating tablet TAKE 1 TABLET BY MOUTH AS NEEDED FOR MIGRAINE. MAY REPEAT IN 2 HOURS IF NEEDED   [DISCONTINUED] albuterol  (VENTOLIN  HFA) 108 (90 Base) MCG/ACT inhaler Inhale 2 puffs into the lungs every 6 (six) hours as needed for wheezing or shortness of breath.   [DISCONTINUED] diclofenac (VOLTAREN) 75 MG EC tablet    [DISCONTINUED] Fluticasone -Salmeterol (ADVAIR DISKUS) 100-50 MCG/DOSE AEPB Inhale 1 puff into the lungs 2 (two) times daily.   [DISCONTINUED] metaxalone (SKELAXIN) 800 MG tablet    [DISCONTINUED] montelukast  (SINGULAIR ) 10 MG tablet Take 1 tablet (10 mg total) by mouth at bedtime.   [DISCONTINUED] predniSONE  (STERAPRED UNI-PAK 21 TAB) 10 MG (21) TBPK tablet 10 MG tablets; take 60 mg (6 pills) day one; 50 mg (5 tabs) day two; 40 mg (4 tabs) day 3; 30  mg (3 tabs) day 4; 20 mg (2 tabs) day 5; 10mg  (one pill) last day. Do not take any other NSAIDS while on this predisone taper.   No facility-administered encounter medications on file as of 06/27/2019.      Objective:   Vitals:   06/27/19 1455  BP: 106/74  Pulse: 77  Temp: 97.9 F (36.6 C)  Height: 5' 5 (1.651 m)  Weight: 191 lb (86.6 kg)  SpO2: 98% Comment: on RA  TempSrc: Temporal  BMI (Calculated): 31.78     Physical  exam documentation is limited by delayed entry of information.

## 2019-06-27 NOTE — Patient Instructions (Addendum)
1.  Your prescriptions have been refilled.  This will be for Singulair and albuterol (Ventolin).  2.  We will see you in 6 months time call sooner should any new difficulties arise.

## 2019-06-30 NOTE — Progress Notes (Signed)
Virtual Visit via Video Note The purpose of this virtual visit is to provide medical care while limiting exposure to the novel coronavirus.    Consent was obtained for video visit:  Yes Answered questions that patient had about telehealth interaction:  Yes I discussed the limitations, risks, security and privacy concerns of performing an evaluation and management service by telemedicine. I also discussed with the patient that there may be a patient responsible charge related to this service. The patient expressed understanding and agreed to proceed.  Pt location: Home Physician Location: office Name of referring provider:  [facility] I connected with Dondra SpryShawan A Wehrenberg at patients initiation/request on 07/01/2019 at  1:30 PM EST by video enabled telemedicine application and verified that I am speaking with the correct person using two identifiers. Pt MRN:  [id number] Pt DOB:  02/09/1976 Video Participants:  Dondra SpryShawan A Boshers   History of Present Illness:  Norma Dunn is a 43 year old woman who follows up for migraines.  UPDATE: Since last visit in May, she has had two intractable headaches requiring rescue therapy.  She received a headache cocktail in the office in July.  She received a prednisone taper followed by another headache cocktail in September.  Due to associated dizziness, she was prescribed meclizine.  Otherwise, headaches have been well-controlled. Mild migraines 2 to 3 days a month, lasting 1 hour.  She may have a severe migraine for several days lasting a day.  Rescue protocol: Rizatriptan (repeats dose after 2 hours 10% of time) Current NSAIDS: no Current analgesics: no Current triptans: rizatriptan 10mg  Current anti-emetic: Zofran 4mg  Current muscle relaxants: no Current anti-anxiolytic: no Current sleep aide: no Current Antihypertensive medications: no Current Antidepressant medications: nortriptyline 25mg  Current Anticonvulsant medications:  no Current Vitamins/Herbal/Supplements: no Current Antihistamines/Decongestants: meclizine Other therapy: no  Caffeine: rarely Alcohol: no Smoker: no Diet: Hydrates. Rarely soda. Watches diet. Exercise: yes Depression/anxiety: No Sleep hygiene:Improved.  HISTORY: Onset:  Since her 6820s Location: Usually bi-frontal or either side Quality: Pressure, sometimes sharp Initial Intensity: Moderate to severe Aura: no Prodrome: forgetful Postdrome: Sometimes fatigue Associated symptoms:  Nausea, photophobia, phonophobia, blurred vision in left eye, no vomiting. She has not had any new worse headache of her life, waking up from sleep Initial Duration: Usually 2 days (but up to a week) Initial Frequency: Varies. Some months up to 18 days per month but she may have 1 or 2 months with little or no headache. Initial Frequency of abortive medication: as needed. Triggers:  Menstrual cycle, stress Relieving factors:  Nothing Activity: Aggravates. Cannot function or go to work 5 days a month.  MRI of brain to evaluate headache from 09/21/04 revealed "borderline Debroah LoopArnold Chiari I malformation" (does not state distance of distension below foramen magnum) and "mild fullness of the pituitary" but otherwise unremarkable.  Past NSAIDS: Ibuprofen 800mg  Past analgesics: Excedrin, Fioricet Past abortive triptans: Sumatriptan (ineffective) Past muscle relaxants: no Past anti-emetic: Reglan (effective), Promethazine 25mg  (effective but causes drowsiness) Past antihypertensive medications: no Past antidepressant medications: no Past anticonvulsant medications: no Past vitamins/Herbal/Supplements: no Other past therapies: no  Family history of headache: Father (migraines)  Past Medical History: Past Medical History:  Diagnosis Date  . Asthma   . Headache     Medications: Outpatient Encounter Medications as of 07/01/2019  Medication Sig  . albuterol  (VENTOLIN HFA) 108 (90 Base) MCG/ACT inhaler Inhale 2 puffs into the lungs every 6 (six) hours as needed for wheezing or shortness of breath.  . butalbital-acetaminophen-caffeine (FIORICET,  ESGIC) 50-325-40 MG tablet Take 1-2 tablets by mouth every 6 (six) hours as needed.  . IBU 800 MG tablet Take 1 tablet by mouth 2 (two) times daily as needed.  . montelukast (SINGULAIR) 10 MG tablet Take 1 tablet (10 mg total) by mouth at bedtime.  . nortriptyline (PAMELOR) 25 MG capsule TAKE 1 CAPSULE (25 MG TOTAL) BY MOUTH AT BEDTIME.  Marland Kitchen ondansetron (ZOFRAN) 4 MG tablet Take 1 tablet (4 mg total) by mouth every 8 (eight) hours as needed for nausea or vomiting.  . rizatriptan (MAXALT-MLT) 10 MG disintegrating tablet TAKE 1 TABLET BY MOUTH AS NEEDED FOR MIGRAINE. MAY REPEAT IN 2 HOURS IF NEEDED   No facility-administered encounter medications on file as of 07/01/2019.     Allergies: Allergies  Allergen Reactions  . Latex Itching    Family History: Family History  Problem Relation Age of Onset  . Migraines Father     Social History: Social History   Socioeconomic History  . Marital status: Single    Spouse name: Not on file  . Number of children: 3  . Years of education: Not on file  . Highest education level: Some college, no degree  Occupational History  . Occupation: Production manager: [employer]  Social Needs  . Financial resource strain: Not on file  . Food insecurity    Worry: Not on file    Inability: Not on file  . Transportation needs    Medical: Not on file    Non-medical: Not on file  Tobacco Use  . Smoking status: Former Smoker    Types: Cigars    Quit date: 08/22/1997    Years since quitting: 21.8  . Smokeless tobacco: Never Used  . Tobacco comment: not a daily smoker.  Substance and Sexual Activity  . Alcohol use: No  . Drug use: Never  . Sexual activity: Not on file  Lifestyle  . Physical activity    Days per week: Not on file    Minutes per session: Not on file   . Stress: Not on file  Relationships  . Social Herbalist on phone: Not on file    Gets together: Not on file    Attends religious service: Not on file    Active member of club or organization: Not on file    Attends meetings of clubs or organizations: Not on file    Relationship status: Not on file  . Intimate partner violence    Fear of current or ex partner: Not on file    Emotionally abused: Not on file    Physically abused: Not on file    Forced sexual activity: Not on file  Other Topics Concern  . Not on file  Social History Narrative   Single, lives with her 3 children. Has been avoiding caffeine. Exercises 2-3 x week for 60 minutes or more.    Observations/Objective:   Height 5\' 5"  (1.651 m), weight 190 lb (86.2 kg). No acute distress.  Alert and oriented.  Speech fluent and not dysarthric.  Language intact.  Eyes orthophoric on primary gaze.  Face symmetric.  Assessment and Plan:   1.  Migraine without aura, without status migrainosus, not intractable 2.  Migraine without aura, without status migrainosus, intractable  1.  For preventative management, nortriptyline 25mg  at bedtime 2.  For abortive therapy, rizatriptan.  Will provide samples to Nurtec for severe migraines. 3.  Limit use of pain relievers to no more than  2 days out of week to prevent risk of rebound or medication-overuse headache. 4.  Keep headache diary 5.  Exercise, hydration, caffeine cessation, sleep hygiene, monitor for and avoid triggers 6.  Consider:  magnesium citrate 400mg  daily, riboflavin 400mg  daily, and coenzyme Q10 100mg  three times daily 7. Follow up 6 months.  Follow Up Instructions:    -I discussed the assessment and treatment plan with the patient. The patient was provided an opportunity to ask questions and all were answered. The patient agreed with the plan and demonstrated an understanding of the instructions.   The patient was advised to call back or seek an in-person  evaluation if the symptoms worsen or if the condition fails to improve as anticipated.    , DO

## 2019-07-01 ENCOUNTER — Other Ambulatory Visit: Payer: Self-pay

## 2019-07-01 ENCOUNTER — Telehealth (INDEPENDENT_AMBULATORY_CARE_PROVIDER_SITE_OTHER): Payer: Federal, State, Local not specified - PPO | Admitting: Neurology

## 2019-07-01 ENCOUNTER — Encounter: Payer: Self-pay | Admitting: Neurology

## 2019-07-01 VITALS — Ht 65.0 in | Wt 190.0 lb

## 2019-07-01 DIAGNOSIS — G43009 Migraine without aura, not intractable, without status migrainosus: Secondary | ICD-10-CM

## 2019-07-01 DIAGNOSIS — G43019 Migraine without aura, intractable, without status migrainosus: Secondary | ICD-10-CM

## 2019-10-07 ENCOUNTER — Telehealth: Payer: Self-pay | Admitting: Neurology

## 2019-10-07 NOTE — Telephone Encounter (Signed)
I would recommend Dr. Ollen Bowl at [facility]

## 2019-10-07 NOTE — Telephone Encounter (Signed)
Patient called in regarding her having Sciatic issues and it feels that its is coming up and moving around her back. She said that it started around 07/2019. Please Call. Thank you

## 2019-10-07 NOTE — Telephone Encounter (Signed)
Any suggestions for patient?

## 2019-10-07 NOTE — Telephone Encounter (Signed)
Sent referral to [place] [facility], notes insurance etc

## 2019-10-07 NOTE — Telephone Encounter (Signed)
Please advise she will go , whom do you want her to go too?

## 2019-10-07 NOTE — Telephone Encounter (Signed)
I can refer her to orthopedics.  They may be able to give her an injection

## 2019-10-21 ENCOUNTER — Telehealth: Payer: Self-pay | Admitting: Neurology

## 2019-10-21 NOTE — Telephone Encounter (Signed)
Patient states that she has appt on 11-13-19 with the neurosurgeon. She would liek to know if he can send a RX in for a muscle relaxer until she can see the Dr on 11-13-19   [facility]

## 2019-10-21 NOTE — Telephone Encounter (Signed)
I advised patient to contact her PCP.

## 2019-11-07 ENCOUNTER — Other Ambulatory Visit: Payer: Self-pay | Admitting: Orthopedic Surgery

## 2019-11-07 DIAGNOSIS — M5441 Lumbago with sciatica, right side: Secondary | ICD-10-CM

## 2019-11-07 DIAGNOSIS — M4807 Spinal stenosis, lumbosacral region: Secondary | ICD-10-CM

## 2019-11-19 ENCOUNTER — Other Ambulatory Visit: Payer: Self-pay

## 2019-11-19 ENCOUNTER — Ambulatory Visit
Admission: RE | Admit: 2019-11-19 | Discharge: 2019-11-19 | Disposition: A | Discharge status: Home / Self Care | Payer: Federal, State, Local not specified - PPO | Source: Ambulatory Visit | Attending: Orthopedic Surgery | Admitting: Orthopedic Surgery

## 2019-11-19 DIAGNOSIS — M4807 Spinal stenosis, lumbosacral region: Secondary | ICD-10-CM | POA: Diagnosis present

## 2019-11-19 DIAGNOSIS — M5442 Lumbago with sciatica, left side: Secondary | ICD-10-CM | POA: Diagnosis not present

## 2019-11-19 DIAGNOSIS — M5441 Lumbago with sciatica, right side: Secondary | ICD-10-CM | POA: Diagnosis present

## 2019-12-29 ENCOUNTER — Other Ambulatory Visit: Payer: Self-pay | Admitting: Neurology

## 2020-01-01 NOTE — Progress Notes (Signed)
Virtual Visit via Video Note The purpose of this virtual visit is to provide medical care while limiting exposure to the novel coronavirus.    Consent was obtained for video visit:  Yes.   Answered questions that patient had about telehealth interaction:  Yes.   I discussed the limitations, risks, security and privacy concerns of performing an evaluation and management service by telemedicine. I also discussed with the patient that there may be a patient responsible charge related to this service. The patient expressed understanding and agreed to proceed.  Pt location: Home Physician Location: office [facility] of referring provider:  [facility] I connected with Norma Dunn at patients initiation/request on 01/03/2020 at  1:30 PM EDT by video enabled telemedicine application and verified that I am speaking with the correct person using two identifiers. Pt MRN:  [id number] Pt DOB:  16-Feb-1976 Video Participants:  Norma Dunn   History of Present Illness:  Norma Dunn is a 44 year old woman who follows up for migraines.  UPDATE: She has not had a headache since she had COVID in November.  She has not had an opportunity to try Nurtec.   Current NSAIDS: no Current analgesics: no Current triptans: rizatriptan 10mg  Current anti-emetic: Zofran 4mg  Current muscle relaxants: no Current anti-anxiolytic: no Current sleep aide: no Current Antihypertensive medications: no Current Antidepressant medications: nortriptyline 25mg  Current Anticonvulsant medications: no Current CGRP inhibitor:  Nurtec Current Vitamins/Herbal/Supplements: no Current Antihistamines/Decongestants: meclizine Other therapy: no  Caffeine: rarely Alcohol: no Smoker: no Diet: Hydrates. Rarely soda. Watches diet. Exercise: yes Depression/anxiety: No Sleep hygiene:Improved. She started having low back pain radiating down the left leg in December.  She saw orthopedics.  MRI  of lumbar spine on 11/20/2019 showed mild broad-based disc bulge with mild bilateral foraminal narrowing and bilateral L5 pars interarticularis defects causing  grade 1 anterolisthesis of L5 on S1. She is currently undergoing physical therapy which is helping.    HISTORY: Onset:  Since her 29s Location: Usually bi-frontal or either side Quality: Pressure, sometimes sharp Initial Intensity: Moderate to severe Aura: no Prodrome: forgetful Postdrome: Sometimes fatigue Associated symptoms: Nausea, photophobia, phonophobia, blurred vision in left eye, no vomiting. She has not had any new worse headache of her life, waking up from sleep Initial Duration: Usually 2 days (but up to a week) Initial Frequency: Varies. Some months up to 18 days per month but she may have 1 or 2 months with little or no headache. Initial Frequency of abortive medication: as needed. Triggers:  Menstrual cycle, stress Relieving factors: Nothing Activity: Aggravates. Cannot function or go to work 5 days a month.  MRI of brain to evaluate headache from 09/21/04 revealed "borderline 11/22/2019 Chiari I malformation"(does not state distance of distension below foramen magnum) and "mild fullness of the pituitary"but otherwise unremarkable.  Past NSAIDS: Ibuprofen 800mg  Past analgesics: Excedrin, Fioricet Past abortive triptans: Sumatriptan (ineffective) Past muscle relaxants:no Past anti-emetic: Reglan (effective), Promethazine 25mg  (effective but causes drowsiness) Past antihypertensive medications: no Past antidepressant medications: no Past anticonvulsant medications: no Past vitamins/Herbal/Supplements: no Other past therapies: no  Family history of headache: Father (migraines)  Past Medical History: Past Medical History:  Diagnosis Date  . Asthma   . Headache     Medications: Outpatient Encounter Medications as of 01/03/2020  Medication Sig  . albuterol (VENTOLIN HFA) 108 (90  Base) MCG/ACT inhaler Inhale 2 puffs into the lungs every 6 (six) hours as needed for wheezing or shortness of breath.  . montelukast (SINGULAIR) 10  MG tablet Take 1 tablet (10 mg total) by mouth at bedtime.  . nortriptyline (PAMELOR) 25 MG capsule TAKE 1 CAPSULE (25 MG TOTAL) BY MOUTH AT BEDTIME.  Marland Kitchen ondansetron (ZOFRAN) 4 MG tablet Take 1 tablet (4 mg total) by mouth every 8 (eight) hours as needed for nausea or vomiting.  . rizatriptan (MAXALT-MLT) 10 MG disintegrating tablet TAKE 1 TABLET BY MOUTH AS NEEDED FOR MIGRAINE. MAY REPEAT IN 2 HOURS IF NEEDED  . IBU 800 MG tablet Take 1 tablet by mouth 2 (two) times daily as needed.   No facility-administered encounter medications on file as of 01/03/2020.    Allergies: Allergies  Allergen Reactions  . Latex Itching    Family History: Family History  Problem Relation Age of Onset  . Healthy Mother   . Migraines Father   . Healthy Brother   . Healthy Child     Social History: Social History   Socioeconomic History  . Marital status: Single    Spouse name: Not on file  . Number of children: 3  . Years of education: Not on file  . Highest education level: Some college, no degree  Occupational History  . Occupation: Production manager: [employer]  Tobacco Use  . Smoking status: Former Smoker    Types: Cigars    Quit date: 08/22/1997    Years since quitting: 22.3  . Smokeless tobacco: Never Used  . Tobacco comment: not a daily smoker.  Substance and Sexual Activity  . Alcohol use: No  . Drug use: Never  . Sexual activity: Not on file  Other Topics Concern  . Not on file  Social History Narrative   Single, lives with her 3 children. Has been avoiding caffeine. Exercises 2-3 x week for 60 minutes or more.   Social Determinants of Health   Financial Resource Strain:   . Difficulty of Paying Living Expenses:   Food Insecurity:   . Worried About Charity fundraiser in the Last Year:   . Arboriculturist in the Last Year:    Transportation Needs:   . Film/video editor (Medical):   Marland Kitchen Lack of Transportation (Non-Medical):   Physical Activity:   . Days of Exercise per Week:   . Minutes of Exercise per Session:   Stress:   . Feeling of Stress :   Social Connections:   . Frequency of Communication with Friends and Family:   . Frequency of Social Gatherings with Friends and Family:   . Attends Religious Services:   . Active Member of Clubs or Organizations:   . Attends Archivist Meetings:   Marland Kitchen Marital Status:   Intimate Partner Violence:   . Fear of Current or Ex-Partner:   . Emotionally Abused:   Marland Kitchen Physically Abused:   . Sexually Abused:     Observations/Objective:   Height 5\' 5"  (1.651 m), weight 200 lb (90.7 kg). No acute distress.  Alert and oriented.  Speech fluent and not dysarthric.  Language intact.  Eyes orthophoric on primary gaze.  Face symmetric.  Assessment and Plan:   Migraine without aura, without status migrainosus, not intractable  1.  For preventative management, nortriptyline 25mg  at bedtime 2.  For abortive therapy, she has both rizatriptan and Nurtec on hand 3.  Limit use of pain relievers to no more than 2 days out of week to prevent risk of rebound or medication-overuse headache. 4.  Keep headache diary 5.  Exercise, hydration, caffeine cessation, sleep  hygiene, monitor for and avoid triggers 6. Follow up 9 months   Follow Up Instructions:    -I discussed the assessment and treatment plan with the patient. The patient was provided an opportunity to ask questions and all were answered. The patient agreed with the plan and demonstrated an understanding of the instructions.   The patient was advised to call back or seek an in-person evaluation if the symptoms worsen or if the condition fails to improve as anticipated.   Cira Servant, DO

## 2020-01-03 ENCOUNTER — Telehealth (INDEPENDENT_AMBULATORY_CARE_PROVIDER_SITE_OTHER): Payer: Federal, State, Local not specified - PPO | Admitting: Neurology

## 2020-01-03 ENCOUNTER — Other Ambulatory Visit: Payer: Self-pay

## 2020-01-03 VITALS — Ht 65.0 in | Wt 200.0 lb

## 2020-01-03 DIAGNOSIS — G43009 Migraine without aura, not intractable, without status migrainosus: Secondary | ICD-10-CM | POA: Diagnosis not present

## 2020-02-26 ENCOUNTER — Encounter: Payer: Self-pay | Admitting: Pulmonary Disease

## 2020-02-26 ENCOUNTER — Other Ambulatory Visit: Payer: Self-pay

## 2020-02-26 ENCOUNTER — Ambulatory Visit: Payer: Federal, State, Local not specified - PPO | Admitting: Pulmonary Disease

## 2020-02-26 VITALS — BP 112/72 | HR 72 | Temp 97.5°F | Ht 62.0 in | Wt 200.2 lb

## 2020-02-26 DIAGNOSIS — R0600 Dyspnea, unspecified: Secondary | ICD-10-CM

## 2020-02-26 DIAGNOSIS — R0609 Other forms of dyspnea: Secondary | ICD-10-CM

## 2020-02-26 MED ORDER — MONTELUKAST SODIUM 10 MG PO TABS: 10.0000 mg | ORAL_TABLET | Freq: Every day | ORAL | 11 refills | Status: DC

## 2020-02-26 NOTE — Progress Notes (Signed)
    Assessment & Plan:  There are no diagnoses linked to this encounter.  Patient Instructions  Follow-up in 6 months time call sooner should any new problems arise.  Please note: late entry documentation due to logistical difficulties during COVID-19 pandemic. This note is filed for information purposes only, and is not intended to be used for billing, nor does it represent the full scope/nature of the visit in question. Please see any associated scanned media linked to date of encounter for additional pertinent information.  Subjective:    HPI: Norma Dunn is a 44 y.o. female presenting to the pulmonology clinic on 02/26/2020 with report of: Follow-up (pt states breathing is doing well. no current sx. )     Outpatient Encounter Medications as of 02/26/2020  Medication Sig   IBU 800 MG tablet Take 1 tablet by mouth 2 (two) times daily as needed.   [DISCONTINUED] albuterol  (VENTOLIN  HFA) 108 (90 Base) MCG/ACT inhaler Inhale 2 puffs into the lungs every 6 (six) hours as needed for wheezing or shortness of breath.   [DISCONTINUED] montelukast  (SINGULAIR ) 10 MG tablet Take 1 tablet (10 mg total) by mouth at bedtime.   [DISCONTINUED] montelukast  (SINGULAIR ) 10 MG tablet Take 1 tablet (10 mg total) by mouth at bedtime.   [DISCONTINUED] nortriptyline  (PAMELOR ) 25 MG capsule TAKE 1 CAPSULE (25 MG TOTAL) BY MOUTH AT BEDTIME.   [DISCONTINUED] ondansetron  (ZOFRAN ) 4 MG tablet Take 1 tablet (4 mg total) by mouth every 8 (eight) hours as needed for nausea or vomiting.   [DISCONTINUED] rizatriptan  (MAXALT -MLT) 10 MG disintegrating tablet TAKE 1 TABLET BY MOUTH AS NEEDED FOR MIGRAINE. MAY REPEAT IN 2 HOURS IF NEEDED   No facility-administered encounter medications on file as of 02/26/2020.      Objective:   Vitals:   02/26/20 1335  BP: 112/72  Pulse: 72  Temp: (!) 97.5 F (36.4 C)  Height: 5' 2 (1.575 m)  Weight: 200 lb 3.2 oz (90.8 kg)  SpO2: 98%  TempSrc: Temporal  BMI (Calculated):  36.61     Physical exam documentation is limited by delayed entry of information.

## 2020-02-26 NOTE — Patient Instructions (Signed)
Follow-up in 6 months time call sooner should any new problems arise. 

## 2020-06-04 ENCOUNTER — Other Ambulatory Visit: Payer: Self-pay | Admitting: Family Medicine

## 2020-06-04 DIAGNOSIS — Z1231 Encounter for screening mammogram for malignant neoplasm of breast: Secondary | ICD-10-CM

## 2020-06-12 ENCOUNTER — Ambulatory Visit
Admission: RE | Admit: 2020-06-12 | Discharge: 2020-06-12 | Disposition: A | Discharge status: Home / Self Care | Payer: Federal, State, Local not specified - PPO | Source: Ambulatory Visit | Attending: Family Medicine | Admitting: Family Medicine

## 2020-06-12 ENCOUNTER — Other Ambulatory Visit: Payer: Self-pay

## 2020-06-12 DIAGNOSIS — Z1231 Encounter for screening mammogram for malignant neoplasm of breast: Secondary | ICD-10-CM | POA: Insufficient documentation

## 2020-06-30 ENCOUNTER — Other Ambulatory Visit: Payer: Self-pay | Admitting: Neurology

## 2020-07-06 ENCOUNTER — Telehealth: Payer: Self-pay | Admitting: Neurology

## 2020-07-06 ENCOUNTER — Other Ambulatory Visit: Payer: Self-pay

## 2020-07-06 ENCOUNTER — Ambulatory Visit (INDEPENDENT_AMBULATORY_CARE_PROVIDER_SITE_OTHER): Payer: Federal, State, Local not specified - PPO

## 2020-07-06 DIAGNOSIS — G43009 Migraine without aura, not intractable, without status migrainosus: Secondary | ICD-10-CM

## 2020-07-06 MED ORDER — KETOROLAC TROMETHAMINE 60 MG/2ML IM SOLN
60.0000 mg | Freq: Once | INTRAMUSCULAR | Status: AC
  Administered 2020-07-06: 60 mg via INTRAMUSCULAR

## 2020-07-06 MED ORDER — METOCLOPRAMIDE HCL 5 MG/ML IJ SOLN
10.0000 mg | Freq: Once | INTRAVENOUS | Status: AC
  Administered 2020-07-06: 10 mg via INTRAMUSCULAR

## 2020-07-06 MED ORDER — DIPHENHYDRAMINE HCL 50 MG/ML IJ SOLN
25.0000 mg | Freq: Once | INTRAMUSCULAR | Status: AC
  Administered 2020-07-06: 25 mg via INTRAVENOUS

## 2020-07-06 NOTE — Telephone Encounter (Signed)
Patient is sch  

## 2020-07-06 NOTE — Telephone Encounter (Signed)
She may come in for headache cocktail if she has a driver (otherwise we can only give her toradol).

## 2020-07-06 NOTE — Telephone Encounter (Signed)
Per pt the access Nurse advised her to call back at 8 am if she hadn't heard from [place] by then. Pt called back, Pt was rude to the front desk staff.   Please advise if pt could come in for a headache cocktail. Pt last seen 01/03/20

## 2020-07-28 ENCOUNTER — Other Ambulatory Visit: Payer: Self-pay | Admitting: Pulmonary Disease

## 2020-09-04 ENCOUNTER — Ambulatory Visit
Admission: EM | Admit: 2020-09-04 | Discharge: 2020-09-04 | Disposition: A | Discharge status: Home / Self Care | Payer: Federal, State, Local not specified - PPO | Attending: Family Medicine | Admitting: Family Medicine

## 2020-09-04 ENCOUNTER — Ambulatory Visit
Admit: 2020-09-04 | Disposition: A | Discharge status: Home / Self Care | Payer: Federal, State, Local not specified - PPO

## 2020-09-04 ENCOUNTER — Encounter: Payer: Self-pay | Admitting: Family Medicine

## 2020-09-04 DIAGNOSIS — R0981 Nasal congestion: Secondary | ICD-10-CM | POA: Diagnosis not present

## 2020-09-04 DIAGNOSIS — Z1152 Encounter for screening for COVID-19: Secondary | ICD-10-CM

## 2020-09-04 NOTE — Discharge Instructions (Addendum)
You can continue the Mucinex for symptoms.  You can also take allergy medication daily to help with the runny nose, congestion Follow up as needed for continued or worsening symptoms Covid test pending  Follow up as needed for continued or worsening symptoms

## 2020-09-04 NOTE — ED Provider Notes (Signed)
Renaldo Fiddler    CSN: [id number] Arrival date & time: 09/04/20  0277      History   Chief Complaint Chief Complaint  Patient presents with  . Nasal Congestion    HPI Norma Dunn is a 45 y.o. female.   Pt is a 45 year old female that presents with nasal congestion, rhinorrhea. This has been present x 1 day. Has been taking mucinex. No fever, cough, sore throat or chest congestion.       Past Medical History:  Diagnosis Date  . Asthma   . Headache     There are no problems to display for this patient.   Past Surgical History:  Procedure Laterality Date  . OOPHORECTOMY  2005   right  . TUBAL LIGATION     left  . TUBAL LIGATION     left    OB History   No obstetric history on file.      Home Medications    Prior to Admission medications   Medication Sig Start Date End Date Taking? Authorizing Provider  albuterol (VENTOLIN HFA) 108 (90 Base) MCG/ACT inhaler TAKE 2 PUFFS BY MOUTH EVERY 6 HOURS AS NEEDED FOR WHEEZE OR SHORTNESS OF BREATH 07/28/20   Salena Saner, MD  IBU 800 MG tablet Take 1 tablet by mouth 2 (two) times daily as needed. 06/01/17   [provider]  montelukast (SINGULAIR) 10 MG tablet Take 1 tablet (10 mg total) by mouth at bedtime. 02/26/20   Salena Saner, MD  nortriptyline (PAMELOR) 25 MG capsule TAKE 1 CAPSULE (25 MG TOTAL) BY MOUTH AT BEDTIME. 12/30/19   Everlena Cooper, Adam R, DO  ondansetron (ZOFRAN) 4 MG tablet Take 1 tablet (4 mg total) by mouth every 8 (eight) hours as needed for nausea or vomiting. 10/23/17   Everlena Cooper, Adam R, DO  rizatriptan (MAXALT-MLT) 10 MG disintegrating tablet TAKE 1 TABLET BY MOUTH AS NEEDED FOR MIGRAINE. MAY REPEAT IN 2 HOURS IF NEEDED 07/01/20   Drema Dallas, DO    Family History Family History  Problem Relation Age of Onset  . Healthy Mother   . Migraines Father   . Healthy Brother   . Healthy Child   . Breast cancer Neg Hx     Social History Social History   Tobacco Use  .  Smoking status: Former Smoker    Types: Cigars    Quit date: 08/22/1997    Years since quitting: 23.0  . Smokeless tobacco: Never Used  . Tobacco comment: not a daily smoker.  Vaping Use  . Vaping Use: Never used  Substance Use Topics  . Alcohol use: No  . Drug use: Never     Allergies   Latex   Review of Systems Review of Systems   Physical Exam Triage Vital Signs ED Triage Vitals  Enc Vitals Group     BP 09/04/20 0935 103/71     Pulse Rate 09/04/20 0935 94     Resp 09/04/20 0935 18     Temp 09/04/20 0935 98.9 F (37.2 C)     Temp Source 09/04/20 0935 Oral     SpO2 09/04/20 0935 96 %     Weight --      Height --      Head Circumference --      Peak Flow --      Pain Score 09/04/20 1020 3     Pain Loc --      Pain Edu? --  Excl. in GC? --    No data found.  Updated Vital Signs BP 103/71 (BP Location: Left Arm)   Pulse 94   Temp 98.9 F (37.2 C) (Oral)   Resp 18   SpO2 96%   Visual Acuity Right Eye Distance:   Left Eye Distance:   Bilateral Distance:    Right Eye Near:   Left Eye Near:    Bilateral Near:     Physical Exam Vitals and nursing note reviewed.  Constitutional:      General: She is not in acute distress.    Appearance: Normal appearance. She is not ill-appearing, toxic-appearing or diaphoretic.  HENT:     Head: Normocephalic.     Right Ear: Tympanic membrane and ear canal normal.     Left Ear: Tympanic membrane and ear canal normal.     Nose: Congestion present.     Mouth/Throat:     Pharynx: Oropharynx is clear.  Eyes:     Conjunctiva/sclera: Conjunctivae normal.  Cardiovascular:     Rate and Rhythm: Normal rate and regular rhythm.  Pulmonary:     Effort: Pulmonary effort is normal.     Breath sounds: Normal breath sounds.  Musculoskeletal:        General: Normal range of motion.     Cervical back: Normal range of motion.  Skin:    General: Skin is warm and dry.     Findings: No rash.  Neurological:     Mental Status:  She is alert.  Psychiatric:        Mood and Affect: Mood normal.      UC Treatments / Results  Labs (all labs ordered are listed, but only abnormal results are displayed) Labs Reviewed  COVID-19, FLU A+B NAA    EKG   Radiology No results found.  Procedures Procedures (including critical care time)  Medications Ordered in UC Medications - No data to display  Initial Impression / Assessment and Plan / UC Course  I have reviewed the triage vital signs and the nursing notes.  Pertinent labs & imaging results that were available during my care of the patient were reviewed by me and considered in my medical decision making (see chart for details).     Nasal congestion 1 day of symptoms. Mucinex for symptoms.  Also recommended allergy medication for runny nose, congestion.  COVID test pending. Follow up as needed for continued or worsening symptoms  Final Clinical Impressions(s) / UC Diagnoses   Final diagnoses:  Nasal congestion     Discharge Instructions     You can continue the Mucinex for symptoms.  You can also take allergy medication daily to help with the runny nose, congestion Follow up as needed for continued or worsening symptoms Covid test pending  Follow up as needed for continued or worsening symptoms     ED Prescriptions    None     PDMP not reviewed this encounter.   Janace Aris, NP 09/04/20 1214

## 2020-09-04 NOTE — ED Triage Notes (Signed)
Pt presents with nasal congestion that began yesterday  

## 2020-09-08 LAB — COVID-19, FLU A+B NAA
Influenza A, NAA: NOT DETECTED
Influenza B, NAA: NOT DETECTED
SARS-CoV-2, NAA: DETECTED — AB

## 2020-09-15 ENCOUNTER — Ambulatory Visit: Payer: Federal, State, Local not specified - PPO | Admitting: Pulmonary Disease

## 2020-09-15 ENCOUNTER — Other Ambulatory Visit: Payer: Self-pay

## 2020-09-15 ENCOUNTER — Encounter: Payer: Self-pay | Admitting: Pulmonary Disease

## 2020-09-15 VITALS — BP 124/70 | HR 73 | Temp 97.3°F | Ht 63.0 in | Wt 192.4 lb

## 2020-09-15 DIAGNOSIS — J4520 Mild intermittent asthma, uncomplicated: Secondary | ICD-10-CM

## 2020-09-15 DIAGNOSIS — Z8616 Personal history of COVID-19: Secondary | ICD-10-CM | POA: Diagnosis not present

## 2020-09-15 NOTE — Progress Notes (Signed)
Subjective:    Patient ID: Norma Dunn, female    DOB: 03-07-1976, 45 y.o.   MRN: [id number]  HPI Patient is a 45 year old very remote former smoker (cigars, quit 1999) who presents for follow-up on health intermittent asthma without complication.  Patient presents for yearly follow-up.  He is currently maintained on albuterol as needed and Singulair for management of chronic rhinitis changes.  She has not had any exacerbations.  Previously she had been having yearly exacerbations.  She is followed at the [facility].  She has not had any recent fevers, chills or sweats.  No cough or sputum production.  Symptoms on 14 January and was noted to have COVID-19.  She completed isolation at home.  She never had increased shortness of breath, only nasal congestion and rhinorrhea.  No anosmia or dysgeusia.  Mucinex helped her symptoms.  She is now symptom-free.   Review of Systems A 10 point review of systems was performed and it is as noted above otherwise negative.  Past Medical History:  Diagnosis Date  . Asthma   . Headache    Allergies  Allergen Reactions  . Latex Itching   Current Meds  Medication Sig  . albuterol (VENTOLIN HFA) 108 (90 Base) MCG/ACT inhaler TAKE 2 PUFFS BY MOUTH EVERY 6 HOURS AS NEEDED FOR WHEEZE OR SHORTNESS OF BREATH  . IBU 800 MG tablet Take 1 tablet by mouth 2 (two) times daily as needed.  . montelukast (SINGULAIR) 10 MG tablet Take 1 tablet (10 mg total) by mouth at bedtime.  . nortriptyline (PAMELOR) 25 MG capsule TAKE 1 CAPSULE (25 MG TOTAL) BY MOUTH AT BEDTIME.  Marland Kitchen ondansetron (ZOFRAN) 4 MG tablet Take 1 tablet (4 mg total) by mouth every 8 (eight) hours as needed for nausea or vomiting.  . rizatriptan (MAXALT-MLT) 10 MG disintegrating tablet TAKE 1 TABLET BY MOUTH AS NEEDED FOR MIGRAINE. MAY REPEAT IN 2 HOURS IF NEEDED   Immunization History  Administered Date(s) Administered  . Influenza,inj,Quad PF,6+ Mos 05/02/2017  .  Influenza-Unspecified 04/29/2019  . Janssen (J&J) SARS-COV-2 Vaccination 01/08/2020      Objective:   Physical Exam BP 124/70 (BP Location: Left Arm, Cuff Size: Normal)   Pulse 73   Temp (!) 97.3 F (36.3 C) (Temporal)   Ht 5\' 3"  (1.6 m)   Wt 192 lb 6.4 oz (87.3 kg)   SpO2 97%   BMI 34.08 kg/m  GENERAL: Well-developed, overweight woman, no acute distress fully ambulatory, no conversational dyspnea HEAD: Normocephalic, atraumatic.  EYES: Pupils equal, round, reactive to light.  No scleral icterus.  MOUTH: Nose/mouth/throat not examined due to masking requirements for COVID 19. NECK: Supple. No thyromegaly. Trachea midline. No JVD.  No adenopathy. PULMONARY: Good air entry bilaterally.  No adventitious sounds. CARDIOVASCULAR: S1 and S2. Regular rate and rhythm.  No rubs, murmurs or gallops heard. ABDOMEN: Benign. MUSCULOSKELETAL: No joint deformity, no clubbing, no edema.  NEUROLOGIC: No focal deficits, no gait disturbance, speech is fluent. SKIN: Intact,warm,dry.  On limited exam, no rashes. PSYCH: Mood and behavior are normal.       Assessment & Plan:     ICD-10-CM   1. Mild intermittent asthma without complication  J45.20 Pulmonary Function Test [facility] Only   PFTs and follow-up 6 months If normal will discharge back to the care of primary MD  2. Personal history of COVID-19  Z86.16    Had mild illness Did not require hospitalization Did not have asthma exacerbation   Orders  Placed This Encounter  Procedures  . Pulmonary Function Test [facility] Only    Within 64mo    Standing Status:   Future    Standing Expiration Date:   09/15/2021    Order Specific Question:   Full PFT: includes the following: basic spirometry, spirometry pre & post bronchodilator, diffusion capacity (DLCO), lung volumes    Answer:   Full PFT   C. Danice Goltz, MD [facility]   *This note was dictated using voice recognition software/Dragon.  Despite best efforts to proofread, errors can occur  which can change the meaning.  Any change was purely unintentional.

## 2020-09-15 NOTE — Patient Instructions (Signed)
We are going to follow you in 6 months time we'll get breathing test before that time.

## 2020-09-18 ENCOUNTER — Encounter: Payer: Self-pay | Admitting: Pulmonary Disease

## 2020-10-04 NOTE — Progress Notes (Signed)
NEUROLOGY FOLLOW UP OFFICE NOTE  CHERRISH VITALI [id number]   Subjective:  Norma Dunn is a 45 year old woman who follows up for migraines.  UPDATE: Migraines returned a few months ago.  She had an intractable migraine lasting a few days back in November (she required a headache cocktail), but have since been stable. Intensity:  moderate Duration:  1 to 2 hours Frequency:  No more than once a month Current NSAIDS: no Current analgesics: no Current triptans: rizatriptan 10mg  Current anti-emetic: Zofran 4mg  Current muscle relaxants: no Current anti-anxiolytic: no Current sleep aide: no Current Antihypertensive medications: no Current Antidepressant medications: nortriptyline 25mg  Current Anticonvulsant medications: no Current CGRP inhibitor:  Nurtec Current Vitamins/Herbal/Supplements: no Current Antihistamines/Decongestants: meclizine Other therapy: no  Caffeine: rarely Alcohol: no Smoker: no Diet: Hydrates. Rarely soda. Watches diet. Exercise: yes Depression/anxiety: No Other pain:  no Sleep hygiene:Improved.   HISTORY: Onset: Since her 45s Location: Usually bi-frontal or either side Quality: Pressure, sometimes sharp Initial Intensity: Moderate to severe Aura: no Prodrome: forgetful Postdrome: Sometimes fatigue Associated symptoms:Nausea,photophobia, phonophobia, blurred vision in left eye, no vomiting. She has not had any new worse headache of her life, waking up from sleep Initial Duration: Usually 2 days (but up to a week) Initial Frequency: Varies. Some months up to 18 days per month but she may have 1 or 2 months with little or no headache. Initial Frequency of abortive medication: as needed. Triggers: Menstrual cycle, stress Relieving factors: Nothing Activity: Aggravates. Cannot function or go to work 5 days a month.  MRI of brain to evaluate headache from 09/21/04 revealed borderline Chiari I  malformation(does not state distance of distension below foramen magnum) and mild fullness of the pituitarybut otherwise unremarkable.  Past NSAIDS: Ibuprofen 800mg  Past analgesics: Excedrin, Fioricet Past abortive triptans: Sumatriptan (ineffective) Past muscle relaxants:no Past anti-emetic: Reglan (effective), Promethazine 25mg  (effective but causes drowsiness) Past antihypertensive medications: no Past antidepressant medications: no Past anticonvulsant medications: no Past CGRP inhibitor:  Nurtec (rescue) Past vitamins/Herbal/Supplements: no Other past therapies: no  Family history of headache: Father (migraines)  She started having low back pain radiating down the left leg in December 2020.  She saw orthopedics.  MRI of lumbar spine on 11/20/2019 showed mild broad-based disc bulge with mild bilateral foraminal narrowing and bilateral L5 pars interarticularis defects causing  grade 1 anterolisthesis of L5 on S1. Underwent physical therapy   PAST MEDICAL HISTORY: Past Medical History:  Diagnosis Date   Asthma    Headache     MEDICATIONS: Current Outpatient Medications on File Prior to Visit  Medication Sig Dispense Refill   albuterol (VENTOLIN HFA) 108 (90 Base) MCG/ACT inhaler TAKE 2 PUFFS BY MOUTH EVERY 6 HOURS AS NEEDED FOR WHEEZE OR SHORTNESS OF BREATH 18 each 6   IBU 800 MG tablet Take 1 tablet by mouth 2 (two) times daily as needed.     montelukast (SINGULAIR) 10 MG tablet Take 1 tablet (10 mg total) by mouth at bedtime. 30 tablet 11   nortriptyline (PAMELOR) 25 MG capsule TAKE 1 CAPSULE (25 MG TOTAL) BY MOUTH AT BEDTIME. 90 capsule 1   ondansetron (ZOFRAN) 4 MG tablet Take 1 tablet (4 mg total) by mouth every 8 (eight) hours as needed for nausea or vomiting. 20 tablet 3   rizatriptan (MAXALT-MLT) 10 MG disintegrating tablet TAKE 1 TABLET BY MOUTH AS NEEDED FOR MIGRAINE. MAY REPEAT IN 2 HOURS IF NEEDED 9 tablet 3   No current  facility-administered medications on file prior to  visit.    ALLERGIES: Allergies  Allergen Reactions   Latex Itching    FAMILY HISTORY: Family History  Problem Relation Age of Onset   Healthy Mother    Migraines Father    Healthy Brother    Healthy Child    Breast cancer Neg Hx     SOCIAL HISTORY: Social History   Socioeconomic History   Marital status: Single    Spouse name: Not on file   Number of children: 3   Years of education: Not on file   Highest education level: Some college, no degree  Occupational History   Occupation: Surveyor, minerals: [employer]  Tobacco Use   Smoking status: Former Smoker    Years: 3.00    Types: Cigars    Quit date: 08/22/1997    Years since quitting: 23.1   Smokeless tobacco: Never Used   Tobacco comment: not a daily smoker.  Vaping Use   Vaping Use: Never used  Substance and Sexual Activity   Alcohol use: No   Drug use: Never   Sexual activity: Not on file  Other Topics Concern   Not on file  Social History Narrative   Single, lives with her 3 children. Has been avoiding caffeine. Exercises 2-3 x week for 60 minutes or more.   Social Determinants of Health   Financial Resource Strain: Not on file  Food Insecurity: Not on file  Transportation Needs: Not on file  Physical Activity: Not on file  Stress: Not on file  Social Connections: Not on file  Intimate Partner Violence: Not on file     Objective:  Blood pressure (!) 139/94, pulse 79, height 5\' 5"  (1.651 m), weight 192 lb 3.2 oz (87.2 kg), SpO2 95 %. General: No acute distress.  Patient appears well-groomed.   Head:  Normocephalic/atraumatic Eyes:  Fundi examined but not visualized Neck: supple, no paraspinal tenderness, full range of motion Heart:  Regular rate and rhythm Lungs:  Clear to auscultation bilaterally Back: No paraspinal tenderness Neurological Exam: alert and oriented to person, place, and time. Attention span and concentration  intact, recent and remote memory intact, fund of knowledge intact.  Speech fluent and not dysarthric, language intact.  CN II-XII intact. Bulk and tone normal, muscle strength 5/5 throughout.  Sensation to light touch intact.  Deep tendon reflexes 2+ throughout.  Finger to nose and heel to shin testing intact.  Gait normal, Romberg negative.   Assessment/Plan:   Migraine without aura, without status migrainosus, not intractable  1.  For preventative management, nortriptyline 25mg  at bedtime 2.  For abortive therapy, rizatriptan 10mg  3.  Limit use of pain relievers to no more than 2 days out of week to prevent risk of rebound or medication-overuse headache. 4.  Keep headache diary 5.  Exercise, hydration, caffeine cessation, sleep hygiene, monitor for and avoid triggers 6. Follow up 1 year   , DO

## 2020-10-05 ENCOUNTER — Other Ambulatory Visit: Payer: Self-pay

## 2020-10-05 ENCOUNTER — Encounter: Payer: Self-pay | Admitting: Neurology

## 2020-10-05 ENCOUNTER — Ambulatory Visit: Payer: Federal, State, Local not specified - PPO | Admitting: Neurology

## 2020-10-05 VITALS — BP 139/94 | HR 79 | Ht 65.0 in | Wt 192.2 lb

## 2020-10-05 DIAGNOSIS — G43009 Migraine without aura, not intractable, without status migrainosus: Secondary | ICD-10-CM | POA: Diagnosis not present

## 2020-10-05 NOTE — Patient Instructions (Signed)
Continue: Nortriptyline 25mg  at bedtime Rizatriptan 10mg  and Zofran/ondansetron as needed Limit use of pain relievers to no more than 2 days out of week to prevent risk of rebound or medication-overuse headache. Keep headache diary

## 2020-10-19 ENCOUNTER — Other Ambulatory Visit: Payer: Self-pay

## 2020-10-19 ENCOUNTER — Encounter: Payer: Self-pay | Admitting: Neurology

## 2020-10-19 ENCOUNTER — Telehealth: Payer: Self-pay | Admitting: Neurology

## 2020-10-19 ENCOUNTER — Ambulatory Visit (INDEPENDENT_AMBULATORY_CARE_PROVIDER_SITE_OTHER): Payer: Federal, State, Local not specified - PPO

## 2020-10-19 DIAGNOSIS — G43019 Migraine without aura, intractable, without status migrainosus: Secondary | ICD-10-CM

## 2020-10-19 MED ORDER — TRUDHESA 0.725 MG/ACT NA AERS: 0.7500 mg | INHALATION_SPRAY | NASAL | 0 refills | Status: DC | PRN

## 2020-10-19 MED ORDER — KETOROLAC TROMETHAMINE 60 MG/2ML IM SOLN
60.0000 mg | Freq: Once | INTRAMUSCULAR | Status: AC
  Administered 2020-10-19: 60 mg via INTRAMUSCULAR

## 2020-10-19 NOTE — Telephone Encounter (Signed)
Pt advised of samples as well as she can come in today to schedule for the Headache cocktail.  Front desk if you could please schedule for the pt to come in today.

## 2020-10-19 NOTE — Progress Notes (Signed)
Trudesa 2 bx given to pt per. Everlena Cooper

## 2020-10-19 NOTE — Telephone Encounter (Signed)
OK.  This is the second time since November that she has had an intractable migraine.  I am concerned that the rizatriptan isn't working.  I would like to give her samples of Trudhesa NS - 1 spray in each nostril at earliest onset of migraine, may repeat once after 1 hour in 24 hour period.  If effective, we can send a prescription

## 2020-10-19 NOTE — Telephone Encounter (Signed)
Patient states she has had a migraine for over a week now. She has tried rizatriptan without relief and states that someone gave her a hydrocodone but that gave very minimal relief. She would like a headache cocktail. She states she will have a driver, but that she doesn't want the one with benadryl. Please call.

## 2020-10-19 NOTE — Telephone Encounter (Signed)
Spoke to the pt she will like to have Toradol shot.

## 2020-10-20 ENCOUNTER — Telehealth: Payer: Self-pay | Admitting: Neurology

## 2020-10-20 DIAGNOSIS — G43019 Migraine without aura, intractable, without status migrainosus: Secondary | ICD-10-CM

## 2020-10-20 MED ORDER — PREDNISONE 10 MG (21) PO TBPK: ORAL_TABLET | ORAL | 0 refills | Status: DC

## 2020-10-20 NOTE — Telephone Encounter (Signed)
Offer her a prednisone taper.  If agreeable, please send in script

## 2020-10-20 NOTE — Telephone Encounter (Signed)
Advised pt of Prednisone taper sent to [facility] in [place].

## 2020-10-20 NOTE — Telephone Encounter (Signed)
Patient came in for headache cocktail yesterday she said, she states that he headache is worse than yesterday and she is dizzy please call

## 2020-10-26 ENCOUNTER — Other Ambulatory Visit: Payer: Self-pay | Admitting: Neurology

## 2020-10-26 ENCOUNTER — Telehealth: Payer: Self-pay

## 2020-10-26 ENCOUNTER — Ambulatory Visit
Admission: RE | Admit: 2020-10-26 | Discharge: 2020-10-26 | Disposition: A | Discharge status: Home / Self Care | Payer: Federal, State, Local not specified - PPO | Source: Ambulatory Visit | Attending: Neurology | Admitting: Neurology

## 2020-10-26 ENCOUNTER — Other Ambulatory Visit: Payer: Self-pay

## 2020-10-26 DIAGNOSIS — G43019 Migraine without aura, intractable, without status migrainosus: Secondary | ICD-10-CM

## 2020-10-26 MED ORDER — NORTRIPTYLINE HCL 50 MG PO CAPS: 50.0000 mg | ORAL_CAPSULE | Freq: Every day | ORAL | 5 refills | Status: DC

## 2020-10-26 MED ORDER — GADOBENATE DIMEGLUMINE 529 MG/ML IV SOLN
18.0000 mL | Freq: Once | INTRAVENOUS | Status: AC | PRN
  Administered 2020-10-26: 18 mL via INTRAVENOUS

## 2020-10-26 NOTE — Telephone Encounter (Signed)
MRI of the brain order placed in Epic. Waiting for PA to come back so we can get her scheduled

## 2020-10-26 NOTE — Progress Notes (Signed)
Pt advised of her MRI results.

## 2020-10-26 NOTE — Telephone Encounter (Signed)
Spoke to Norma Dunn in regards to her headache and Mri results. Per Norma Dunn the headache is still the same. It hasn;t let up. The headache cocktail did not help as well as the Prednisone taper.   Norma Dunn advised at her last visit to wait to start the Trudesa.  Norma Dunn wanted to know what she should do? Please advise

## 2020-10-26 NOTE — Telephone Encounter (Signed)
I would like to get urgent MRI of brain with and without contrast for worsening intractable headache

## 2020-10-26 NOTE — Telephone Encounter (Signed)
Patient came in on 10/19/20 and got a headache cocktail. It did not work. The prednisone did not work either. She was seen at the urgent care over the weekend to get another one. She was told by them to contact us. She says she is worried because "something isn't right".

## 2020-10-26 NOTE — Telephone Encounter (Signed)
I don't have any other suggestions that I can offer.  We gave her headache cocktail, prednisone taper and she had another cocktail.  I have increased her nortriptyline, but those effects won't be immediate.  I want her to try Trudhesa.

## 2020-10-26 NOTE — Telephone Encounter (Signed)
Pt called no answer left a voice mail that Dr Everlena Cooper would like for her to have an  MRI and that [facility] imaging would be calling her to get her scheduled if she has any questions to call the office back

## 2020-10-27 ENCOUNTER — Telehealth: Payer: Self-pay | Admitting: Neurology

## 2020-10-27 DIAGNOSIS — G43009 Migraine without aura, not intractable, without status migrainosus: Secondary | ICD-10-CM

## 2020-10-27 NOTE — Telephone Encounter (Signed)
Pt advised to try the Trudesa.

## 2020-10-27 NOTE — Telephone Encounter (Signed)
Pt states that the medication not helping.  Advised that if the medication not helping she may need to go the ED if it gets worse.

## 2020-10-27 NOTE — Telephone Encounter (Signed)
Patient left message on VM stating that she was calling back to give the Feedback for the medication that we gave her please call

## 2020-10-28 ENCOUNTER — Emergency Department
Admission: EM | Admit: 2020-10-28 | Discharge: 2020-10-28 | Disposition: A | Discharge status: Home / Self Care | Payer: Federal, State, Local not specified - PPO | Attending: Emergency Medicine | Admitting: Emergency Medicine

## 2020-10-28 ENCOUNTER — Other Ambulatory Visit: Payer: Self-pay | Admitting: Neurology

## 2020-10-28 ENCOUNTER — Encounter: Payer: Self-pay | Admitting: Neurology

## 2020-10-28 ENCOUNTER — Other Ambulatory Visit: Payer: Self-pay

## 2020-10-28 DIAGNOSIS — G43801 Other migraine, not intractable, with status migrainosus: Secondary | ICD-10-CM | POA: Insufficient documentation

## 2020-10-28 DIAGNOSIS — Z91040 Latex allergy status: Secondary | ICD-10-CM | POA: Insufficient documentation

## 2020-10-28 DIAGNOSIS — J45909 Unspecified asthma, uncomplicated: Secondary | ICD-10-CM | POA: Diagnosis not present

## 2020-10-28 DIAGNOSIS — Z87891 Personal history of nicotine dependence: Secondary | ICD-10-CM | POA: Insufficient documentation

## 2020-10-28 DIAGNOSIS — R519 Headache, unspecified: Secondary | ICD-10-CM | POA: Diagnosis present

## 2020-10-28 MED ORDER — MAGNESIUM SULFATE 2 GM/50ML IV SOLN
2.0000 g | Freq: Once | INTRAVENOUS | Status: AC
  Administered 2020-10-28: 2 g via INTRAVENOUS
  Filled 2020-10-28: qty 50

## 2020-10-28 MED ORDER — DIPHENHYDRAMINE HCL 50 MG/ML IJ SOLN
50.0000 mg | Freq: Once | INTRAMUSCULAR | Status: AC
  Administered 2020-10-28: 50 mg via INTRAVENOUS
  Filled 2020-10-28: qty 1

## 2020-10-28 MED ORDER — QULIPTA 60 MG PO TABS: 60.0000 mg | ORAL_TABLET | Freq: Every day | ORAL | 5 refills | Status: DC

## 2020-10-28 MED ORDER — PROCHLORPERAZINE EDISYLATE 10 MG/2ML IJ SOLN
10.0000 mg | Freq: Once | INTRAMUSCULAR | Status: AC
  Administered 2020-10-28: 10 mg via INTRAVENOUS
  Filled 2020-10-28: qty 2

## 2020-10-28 MED ORDER — LACTATED RINGERS IV BOLUS
1000.0000 mL | Freq: Once | INTRAVENOUS | Status: AC
  Administered 2020-10-28: 1000 mL via INTRAVENOUS

## 2020-10-28 NOTE — Telephone Encounter (Signed)
Patient went to the ED this morning and they state that her blood pressure is high. It was 130/101. She wants to speak to someone about this, she still has a migraine. This one is different from her others. She wants to know if the blood pressure being high is from the Migraine or if it is causing the Migraine   Please call

## 2020-10-28 NOTE — ED Triage Notes (Signed)
Pt states shes been having migraine since 2/22, saw neurologist 2/28 who gave her a migraine shot, then was prescribed prednisone the following day. Pt went to UC on Sunday and was given 2 migraine shots and pt has had no relief. Pt also had MRI done Monday and was told it was negative for anything concerning. Pt states she still has migraine.

## 2020-10-28 NOTE — Telephone Encounter (Signed)
Hey this one you will have to send to the [place] program because I ran the PA through Cover My Meds and ins is saying that its not a drug under the formulary covered drugs. Waiting on them to fax info over to see if there is an appeal option but usually for those there isn't. Can we send to the [place] program?

## 2020-10-28 NOTE — ED Provider Notes (Signed)
[facility] ([facility]) Emergency Department Provider Note  ____________________________________________   Event Date/Time   First MD Initiated Contact with Patient 10/28/20 [phone]     (approximate)  I have reviewed the triage vital signs and the nursing notes.   HISTORY  Chief Complaint Migraine   HPI Norma Dunn is a 45 y.o. female the past medical history of asthma and recurrent migraine headaches who presents for assessment of headache she states has been ongoing since 3/22.  She states she saw a neurologist on the 28th ED for something shot and she has been trying her triptan's as well as Tylenol ibuprofen but her headache is still present and has not improved.  She also states she went to urgent care on 3/6 where she received a headache cocktail but this did not help.  She endorses some mild photophobia but denies any significant phonophobia, vomiting, diarrhea, dysuria, rash, chest pain, cough, shortness of breath, vertigo, back pain, extremity pain weakness numbness or tingling or recent falls or injuries.  Denies being on any anticoagulation.  States she has had headaches that lasted several weeks before.  Denies any other acute concerns at this time.         Past Medical History:  Diagnosis Date  . Asthma   . Headache     There are no problems to display for this patient.   Past Surgical History:  Procedure Laterality Date  . OOPHORECTOMY  2005   right  . TUBAL LIGATION     left  . TUBAL LIGATION     left    Prior to Admission medications   Medication Sig Start Date End Date Taking? Authorizing Provider  albuterol (VENTOLIN HFA) 108 (90 Base) MCG/ACT inhaler TAKE 2 PUFFS BY MOUTH EVERY 6 HOURS AS NEEDED FOR WHEEZE OR SHORTNESS OF BREATH 07/28/20   Salena Saner, MD  Dihydroergotamine Mesylate HFA (TRUDHESA) 0.725 MG/ACT AERS Place 0.75 mg into the nose as needed (take at the earlies on set of Migraine). 10/19/20   Everlena Cooper, Adam R, DO  IBU 800  MG tablet Take 1 tablet by mouth 2 (two) times daily as needed. 06/01/17   [provider]  montelukast (SINGULAIR) 10 MG tablet Take 1 tablet (10 mg total) by mouth at bedtime. 02/26/20   Salena Saner, MD  nortriptyline (PAMELOR) 50 MG capsule Take 1 capsule (50 mg total) by mouth at bedtime. 10/26/20   Everlena Cooper, Adam R, DO  ondansetron (ZOFRAN) 4 MG tablet Take 1 tablet (4 mg total) by mouth every 8 (eight) hours as needed for nausea or vomiting. 10/23/17   Everlena Cooper, Adam R, DO  predniSONE (STERAPRED UNI-PAK 21 TAB) 10 MG (21) TBPK tablet take 60mg  day 1, then 50mg  day 2, then 40mg  day 3, then 30mg  day 4, then 20mg  day 5, then 10mg  day 6, then STOP 10/20/20   Jaffe, Adam R, DO  rizatriptan (MAXALT-MLT) 10 MG disintegrating tablet TAKE 1 TABLET BY MOUTH AS NEEDED FOR MIGRAINE. MAY REPEAT IN 2 HOURS IF NEEDED 07/01/20   , DO    Allergies Latex  Family History  Problem Relation Age of Onset  . Healthy Mother   . Migraines Father   . Healthy Brother   . Healthy Child   . Breast cancer Neg Hx     Social History Social History   Tobacco Use  . Smoking status: Former Smoker    Years: 3.00    Types: Cigars    Quit date: 08/22/1997  Years since quitting: 23.2  . Smokeless tobacco: Never Used  . Tobacco comment: not a daily smoker.  Vaping Use  . Vaping Use: Never used  Substance Use Topics  . Alcohol use: No  . Drug use: Never    Review of Systems  Review of Systems  Constitutional: Negative for chills and fever.  HENT: Negative for sore throat.   Eyes: Positive for photophobia. Negative for pain.  Respiratory: Negative for cough and stridor.   Cardiovascular: Negative for chest pain.  Gastrointestinal: Negative for vomiting.  Skin: Negative for rash.  Neurological: Positive for headaches. Negative for seizures and loss of consciousness.  Psychiatric/Behavioral: Negative for suicidal ideas.  All other systems reviewed and are negative.      ____________________________________________   PHYSICAL EXAM:  VITAL SIGNS: ED Triage Vitals  Enc Vitals Group     BP 10/28/20 0032 (!) 130/101     Pulse Rate 10/28/20 0032 74     Resp 10/28/20 0032 16     Temp 10/28/20 0032 98.1 F (36.7 C)     Temp src --      SpO2 10/28/20 0032 96 %     Weight 10/28/20 0031 190 lb (86.2 kg)     Height 10/28/20 0031 5\' 5"  (1.651 m)     Head Circumference --      Peak Flow --      Pain Score 10/28/20 0030 8     Pain Loc --      Pain Edu? --      Excl. in GC? --    Vitals:   10/28/20 0032  BP: (!) 130/101  Pulse: 74  Resp: 16  Temp: 98.1 F (36.7 C)  SpO2: 96%   Physical Exam Vitals and nursing note reviewed.  Constitutional:      General: She is not in acute distress.    Appearance: She is well-developed and well-nourished.  HENT:     Head: Normocephalic and atraumatic.     Right Ear: External ear normal.     Left Ear: External ear normal.     Nose: Nose normal.  Eyes:     Conjunctiva/sclera: Conjunctivae normal.  Cardiovascular:     Rate and Rhythm: Normal rate and regular rhythm.     Heart sounds: No murmur heard.   Pulmonary:     Effort: Pulmonary effort is normal. No respiratory distress.     Breath sounds: Normal breath sounds.  Abdominal:     Palpations: Abdomen is soft.     Tenderness: There is no abdominal tenderness.  Musculoskeletal:        General: No edema.     Cervical back: Neck supple. No rigidity.  Skin:    General: Skin is warm and dry.     Capillary Refill: Capillary refill takes less than 2 seconds.  Neurological:     Mental Status: She is alert and oriented to person, place, and time.  Psychiatric:        Mood and Affect: Mood and affect and mood normal.     Cranial nerves II through XII grossly intact.  No pronator drift.  No finger dysmetria.  Symmetric 5/5 strength of all extremities.  Sensation intact to light touch in all extremities.  Unremarkable unassisted  gait.  ____________________________________________   LABS (all labs ordered are listed, but only abnormal results are displayed)  Labs Reviewed - No data to display ____________________________________________  EKG  ____________________________________________  RADIOLOGY  ED MD interpretation:   Official radiology report(s):  No results found.  ____________________________________________   PROCEDURES  Procedure(s) performed (including Critical Care):  .1-3 Lead EKG Interpretation Performed by: Gilles Chiquito, MD Authorized by: Gilles Chiquito, MD     Interpretation: normal     ECG rate assessment: normal     Rhythm: sinus rhythm     Ectopy: none     Conduction: normal       ____________________________________________   INITIAL IMPRESSION / ASSESSMENT AND PLAN / ED COURSE      Patient presents with above-stated reexam complaining of nearly 2 weeks of headache associated with photophobia that she states is similar but worse than her typical migraine headaches.  On arrival she is afebrile and hemodynamically stable.  She has a nonfocal neuro exam.  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  Suspect likely status migrainous.  No focal deficits or finding on MRI obtained after symptom onset to suggest stroke.  Low suspicion that patient's care malformation is contributing presentation is this is likely chronic and patient has had on and off migraine headaches throughout her life she says.  No history or exam findings to suggest acute traumatic injury or acute infectious process.  Very low suspicion for venous anastomosis or significant metabolic derangement.  On reassessment patient stated she felt much better.  Given stable vitals with reassuring exam recent MRI obtained after symptom onset and improvement in symptoms  with below noted migraine cocktail who she is safe for discharge with plan for outpatient neurology follow-up.  Discharged stable condition.  Strict return precautions advised and discussed.       ____________________________________________   FINAL CLINICAL IMPRESSION(S) / ED DIAGNOSES  Final diagnoses:  Other migraine with status migrainosus, not intractable    Medications  lactated ringers bolus 1,000 mL (0 mLs Intravenous Stopped 10/28/20 0304)  magnesium sulfate IVPB 2 g 50 mL (0 g Intravenous Stopped 10/28/20 0302)  prochlorperazine (COMPAZINE) injection 10 mg (10 mg Intravenous Given 10/28/20 0158)  diphenhydrAMINE (BENADRYL) injection 50 mg (50 mg Intravenous Given 10/28/20 0200)     ED Discharge Orders    None       Note:  This document was prepared using Dragon voice recognition software and may include unintentional dictation errors.   Gilles Chiquito, MD 10/28/20 [phone]

## 2020-10-28 NOTE — Progress Notes (Signed)
Tashiya Jakubiak Key: [id number] help? Call us at [phone] Outcome Additional Information Required The requested medication is not covered under the [employer] formulary. These non-covered drugs have available covered options in the same therapeutic class which are listed along with information on the formulary exception process for [employer] online: <[email or web address]> The full approved drug list is available at: <[email or web address]> Drug Qulipta 60MG  tablets Form [employer] Electronic PA Form [id number] NCPDP)

## 2020-10-28 NOTE — Telephone Encounter (Signed)
Script for [employer] sent to My Script

## 2020-10-28 NOTE — Telephone Encounter (Signed)
Telephone call to pt, Advised pt DR.Everlena Cooper will review her notes from the ED.  Per DR,Jaffe please start Qulitpa 60 mg daily. Christy Sartorius only as needed for a new onset headache.    Please f/u with your PCP in regards to your BP elevation.     Chelsea when you get a chance can you start a PA for this pt. Please.

## 2020-10-29 NOTE — Telephone Encounter (Signed)
ok 

## 2020-11-03 ENCOUNTER — Telehealth: Payer: Self-pay

## 2020-11-03 DIAGNOSIS — Z0279 Encounter for issue of other medical certificate: Secondary | ICD-10-CM

## 2020-11-03 NOTE — Telephone Encounter (Signed)
Telephone call to pt,advised pt we received FMLA paperwork for her.   Per DR.Jaffe for estimated days absent, He will put 1 days per week.  Pt states that is okay whatever he decides is best.

## 2020-11-19 NOTE — Telephone Encounter (Signed)
Done but she will need to make a sooner appointment (5-6 months), in addition to the scheduled appointment next February

## 2020-11-19 NOTE — Telephone Encounter (Signed)
Pt advised. Pt will pick up paperwork 11/19/20

## 2020-11-19 NOTE — Telephone Encounter (Signed)
Spoke to pt face to face, Dr.Jaffe will review and we will call her when it is ready for pick up.

## 2020-11-19 NOTE — Telephone Encounter (Signed)
Patient left message on the VM stating that she was bringing the [facility] paperwork back because they are needing more information from [place].

## 2020-11-20 ENCOUNTER — Other Ambulatory Visit: Payer: Self-pay | Admitting: Neurology

## 2021-01-01 ENCOUNTER — Other Ambulatory Visit: Payer: Self-pay | Admitting: Neurology

## 2021-02-05 ENCOUNTER — Telehealth: Payer: Self-pay

## 2021-02-05 NOTE — Telephone Encounter (Signed)
Called and spoke to pt aware of upcoming Covid test.

## 2021-02-09 ENCOUNTER — Other Ambulatory Visit: Payer: Self-pay

## 2021-02-09 ENCOUNTER — Other Ambulatory Visit
Admission: RE | Admit: 2021-02-09 | Discharge: 2021-02-09 | Disposition: A | Discharge status: Home / Self Care | Payer: Federal, State, Local not specified - PPO | Source: Ambulatory Visit | Attending: Pulmonary Disease | Admitting: Pulmonary Disease

## 2021-02-09 DIAGNOSIS — Z01812 Encounter for preprocedural laboratory examination: Secondary | ICD-10-CM | POA: Diagnosis present

## 2021-02-09 DIAGNOSIS — Z20822 Contact with and (suspected) exposure to covid-19: Secondary | ICD-10-CM | POA: Diagnosis not present

## 2021-02-09 LAB — SARS CORONAVIRUS 2 (TAT 6-24 HRS): SARS Coronavirus 2: NEGATIVE

## 2021-02-10 ENCOUNTER — Ambulatory Visit: Payer: Federal, State, Local not specified - PPO | Attending: Pulmonary Disease

## 2021-02-10 DIAGNOSIS — J4520 Mild intermittent asthma, uncomplicated: Secondary | ICD-10-CM | POA: Insufficient documentation

## 2021-03-05 NOTE — Progress Notes (Signed)
NEUROLOGY FOLLOW UP OFFICE NOTE  Norma Dunn [id number]  Assessment/Plan:   Migraine without aura, without status migrainosus, not intractable  Migraine prevention:  Qulipta 60mg  daily Migraine rescue:  rizatriptan 10mg ; Zofran for nausea Limit use of pain relievers to no more than 2 days out of week to prevent risk of rebound or medication-overuse headache. Keep headache diary Follow up 6 months.   Subjective:  Norma Dunn is a 45 year old woman who follows up for migraines.   UPDATE: Soon after her follow up in February, she developed an intractable migraine lasting over a week.  She did not respond to migraine cocktail or prednisone taper.  Nortriptyline was discontinued due to high blood pressure and she was started on Qulipta.  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  Nortriptyline was increased to 50mg  at bedtime.  She tried 08-[birth year]-1974 which was ineffective. Now that headaches are better controlled, responding to rizatriptan again. Intensity:  moderate Duration:  1 to 2 hours Frequency:  No more than once a month Current NSAIDS:  no Current analgesics:  no Current triptans:  rizatriptan 10mg  Current anti-emetic:  Zofran 4mg  Current muscle relaxants:  no Current anti-anxiolytic:  no Current sleep aide:  no Current Antihypertensive medications:  no Current Antidepressant medications:  no Current Anticonvulsant medications:  no Current CGRP inhibitor:  Qulipta 60mg  QD, Nurtec Current Vitamins/Herbal/Supplements:  no Current Antihistamines/Decongestants:  meclizine Other therapy:  no   Caffeine:  rarely Alcohol:  no Smoker:  no Diet:  Hydrates.  Rarely soda.  Watches diet. Exercise:  yes Depression/anxiety:  No Other pain:  no Sleep hygiene:  Improved.     HISTORY: Onset:  Since her 61s Location:   Usually bi-frontal or either side Quality:  Pressure, sometimes sharp Initial Intensity:  Moderate to severe Aura:  no Prodrome:  forgetful Postdrome:  Sometimes fatigue Associated symptoms:  Nausea, photophobia, phonophobia, blurred vision in left eye, no vomiting.  She has not had any new worse headache of her life, waking up from sleep Initial Duration:  Usually 2 days (but up to a week) Initial Frequency:  Varies.  Some months up to 18 days per month but she may have 1 or 2 months with little or no headache. Initial Frequency of abortive medication: as needed. Triggers:  Menstrual cycle, stress Relieving factors:  Nothing Activity:  Aggravates.  Cannot function or go to work 5 days a month.   MRI of brain to evaluate headache from 09/21/04 revealed "borderline [place] Chiari I malformation" (does not state distance of distension below foramen magnum) and "mild fullness of the pituitary" but otherwise unremarkable.   Past NSAIDS:  Ibuprofen 800mg  Past analgesics:  Excedrin, Fioricet Past abortive triptans:  Sumatriptan (ineffective) Past ergot:  Trudhesa NS Past muscle relaxants:  no Past anti-emetic:  Reglan (effective), Promethazine 25mg  (effective but causes drowsiness) Past antihypertensive medications:  no Past antidepressant medications:  nortriptyline (elevated blood pressure) Past anticonvulsant medications:  no Past CGRP inhibitor:  Nurtec (rescue) Past vitamins/Herbal/Supplements:  no Other past therapies:  no   Family history of headache:  Father (migraines)   She started having low back pain radiating down the left leg in December 2020.  She saw orthopedics.  MRI of lumbar spine on 11/20/2019 showed mild broad-based disc bulge with mild bilateral foraminal narrowing and bilateral L5 pars interarticularis defects causing  grade  1 anterolisthesis of L5 on S1. Underwent physical therapy  PAST MEDICAL HISTORY: Past Medical History:  Diagnosis Date   Asthma    Headache      MEDICATIONS: Current Outpatient Medications on File Prior to Visit  Medication Sig Dispense Refill   albuterol (VENTOLIN HFA) 108 (90 Base) MCG/ACT inhaler TAKE 2 PUFFS BY MOUTH EVERY 6 HOURS AS NEEDED FOR WHEEZE OR SHORTNESS OF BREATH 18 each 6   Atogepant (QULIPTA) 60 MG TABS Take 60 mg by mouth daily. 30 tablet 5   Dihydroergotamine Mesylate HFA (TRUDHESA) 0.725 MG/ACT AERS Place 0.75 mg into the nose as needed (take at the earlies on set of Migraine). 2 mL 0   IBU 800 MG tablet Take 1 tablet by mouth 2 (two) times daily as needed.     montelukast (SINGULAIR) 10 MG tablet Take 1 tablet (10 mg total) by mouth at bedtime. 30 tablet 11   nortriptyline (PAMELOR) 50 MG capsule TAKE 1 CAPSULE BY MOUTH AT BEDTIME. 90 capsule 0   ondansetron (ZOFRAN) 4 MG tablet Take 1 tablet (4 mg total) by mouth every 8 (eight) hours as needed for nausea or vomiting. 20 tablet 3   predniSONE (STERAPRED UNI-PAK 21 TAB) 10 MG (21) TBPK tablet take 60mg  day 1, then 50mg  day 2, then 40mg  day 3, then 30mg  day 4, then 20mg  day 5, then 10mg  day 6, then STOP 21 tablet 0   rizatriptan (MAXALT-MLT) 10 MG disintegrating tablet Take 1 tablet (10 mg total) by mouth as needed for migraine. 9 tablet 3   No current facility-administered medications on file prior to visit.    ALLERGIES: Allergies  Allergen Reactions   Latex Itching    FAMILY HISTORY: Family History  Problem Relation Age of Onset   Healthy Mother    Migraines Father    Healthy Brother    Healthy Child    Breast cancer Neg Hx       Objective:  Blood pressure 127/88, pulse 62, height 5\' 5"  (1.651 m), weight 191 lb 9.6 oz (86.9 kg), SpO2 97 %. General: No acute distress.  Patient appears well-groomed.   Head:  Normocephalic/atraumatic Eyes:  Fundi examined but not visualized Neck: supple, no paraspinal tenderness, full range of motion Heart:  Regular rate and rhythm Lungs:  Clear to auscultation bilaterally Back: No paraspinal  tenderness Neurological Exam: alert and oriented to person, place, and time.  Speech fluent and not dysarthric, language intact.  CN II-XII intact. Bulk and tone normal, muscle strength 5/5 throughout.  Sensation to light touch intact.  Deep tendon reflexes 2+ throughout, toes downgoing.  Finger to nose testing intact.  Gait normal, Romberg negative.   , DO

## 2021-03-08 ENCOUNTER — Ambulatory Visit (INDEPENDENT_AMBULATORY_CARE_PROVIDER_SITE_OTHER): Payer: Federal, State, Local not specified - PPO | Admitting: Neurology

## 2021-03-08 ENCOUNTER — Encounter: Payer: Self-pay | Admitting: Neurology

## 2021-03-08 ENCOUNTER — Other Ambulatory Visit: Payer: Self-pay

## 2021-03-08 VITALS — BP 127/88 | HR 62 | Ht 65.0 in | Wt 191.6 lb

## 2021-03-08 DIAGNOSIS — G43009 Migraine without aura, not intractable, without status migrainosus: Secondary | ICD-10-CM

## 2021-03-08 DIAGNOSIS — G935 Compression of brain: Secondary | ICD-10-CM

## 2021-03-08 NOTE — Patient Instructions (Signed)
Qulipta 60mg  daily Take rizatriptan with ondansetron (for nausea) as needed Limit use of pain relievers to no more than 2 days out of week to prevent risk of rebound or medication-overuse headache. Keep headache diary Follow up 6 months.

## 2021-03-26 ENCOUNTER — Other Ambulatory Visit: Payer: Self-pay | Admitting: Neurology

## 2021-04-19 ENCOUNTER — Other Ambulatory Visit: Payer: Self-pay | Admitting: Neurology

## 2021-04-28 ENCOUNTER — Other Ambulatory Visit: Payer: Self-pay | Admitting: Pulmonary Disease

## 2021-05-17 ENCOUNTER — Other Ambulatory Visit: Payer: Self-pay

## 2021-05-17 ENCOUNTER — Emergency Department: Payer: Federal, State, Local not specified - PPO

## 2021-05-17 ENCOUNTER — Emergency Department
Admission: EM | Admit: 2021-05-17 | Discharge: 2021-05-17 | Disposition: A | Discharge status: Home / Self Care | Payer: Federal, State, Local not specified - PPO | Attending: Emergency Medicine | Admitting: Emergency Medicine

## 2021-05-17 DIAGNOSIS — Z87891 Personal history of nicotine dependence: Secondary | ICD-10-CM | POA: Diagnosis not present

## 2021-05-17 DIAGNOSIS — R1032 Left lower quadrant pain: Secondary | ICD-10-CM | POA: Diagnosis not present

## 2021-05-17 DIAGNOSIS — Z91040 Latex allergy status: Secondary | ICD-10-CM | POA: Insufficient documentation

## 2021-05-17 DIAGNOSIS — R102 Pelvic and perineal pain: Secondary | ICD-10-CM

## 2021-05-17 DIAGNOSIS — J45909 Unspecified asthma, uncomplicated: Secondary | ICD-10-CM | POA: Diagnosis not present

## 2021-05-17 LAB — COMPREHENSIVE METABOLIC PANEL
ALT: 14 U/L (ref 0–44)
AST: 18 U/L (ref 15–41)
Albumin: 3.8 g/dL (ref 3.5–5.0)
Alkaline Phosphatase: 64 U/L (ref 38–126)
Anion gap: 7 (ref 5–15)
BUN: 13 mg/dL (ref 6–20)
CO2: 25 mmol/L (ref 22–32)
Calcium: 8.7 mg/dL — ABNORMAL LOW (ref 8.9–10.3)
Chloride: 108 mmol/L (ref 98–111)
Creatinine, Ser: 0.81 mg/dL (ref 0.44–1.00)
GFR, Estimated: >60 mL/min (ref >60)
Glucose, Bld: 101 mg/dL — ABNORMAL HIGH (ref 70–99)
Potassium: 3.7 mmol/L (ref 3.5–5.1)
Sodium: 140 mmol/L (ref 135–145)
Total Bilirubin: 0.7 mg/dL (ref 0.3–1.2)
Total Protein: 6.7 g/dL (ref 6.5–8.1)

## 2021-05-17 LAB — CBC
HCT: 38.3 % (ref 36.0–46.0)
Hemoglobin: 13 g/dL (ref 12.0–15.0)
MCH: 28.6 pg (ref 26.0–34.0)
MCHC: 33.9 g/dL (ref 30.0–36.0)
MCV: 84.2 fL (ref 80.0–100.0)
Platelets: 258 10*3/uL (ref 150–400)
RBC: 4.55 MIL/uL (ref 3.87–5.11)
RDW: 13 % (ref 11.5–15.5)
WBC: 7.6 10*3/uL (ref 4.0–10.5)
nRBC: 0 % (ref 0.0–0.2)

## 2021-05-17 LAB — URINALYSIS, COMPLETE (UACMP) WITH MICROSCOPIC
Bilirubin Urine: NEGATIVE
Glucose, UA: NEGATIVE mg/dL
Ketones, ur: NEGATIVE mg/dL
Leukocytes,Ua: NEGATIVE
Nitrite: NEGATIVE
Protein, ur: NEGATIVE mg/dL
Specific Gravity, Urine: 1.018 (ref 1.005–1.030)
pH: 6 (ref 5.0–8.0)

## 2021-05-17 LAB — WET PREP, GENITAL
Clue Cells Wet Prep HPF POC: NONE SEEN
Sperm: NONE SEEN
Trich, Wet Prep: NONE SEEN
Yeast Wet Prep HPF POC: NONE SEEN

## 2021-05-17 LAB — PREGNANCY, URINE: Preg Test, Ur: NEGATIVE

## 2021-05-17 LAB — LIPASE, BLOOD: Lipase: 32 U/L (ref 11–51)

## 2021-05-17 MED ORDER — IBUPROFEN 600 MG PO TABS
600.0000 mg | ORAL_TABLET | Freq: Once | ORAL | Status: AC
  Administered 2021-05-17: 600 mg via ORAL
  Filled 2021-05-17: qty 1

## 2021-05-17 NOTE — ED Provider Notes (Addendum)
[facility] Emergency Department Provider Note ____________________________________________   Event Date/Time   First MD Initiated Contact with Patient 05/17/21 0710     (approximate)  I have reviewed the triage vital signs and the nursing notes.   HISTORY  Chief Complaint Abdominal Pain    HPI Norma Dunn is a 45 y.o. female with PMH as noted below including asthma and migraine headaches who presents with left lower abdominal pain over the last 3 days, persistent course, acutely worsened yesterday.  She denies any associated nausea or vomiting, diarrhea, or vaginal bleeding but has had a small amount of discharge.  She denies fever or chills.  She states that the pain feels similar to when she has had an ovarian cyst in the past.  She states that her right ovary was removed previously because of a cyst.  Past Medical History:  Diagnosis Date   Asthma    Headache     There are no problems to display for this patient.   Past Surgical History:  Procedure Laterality Date   OOPHORECTOMY  2005   right   TUBAL LIGATION     left   TUBAL LIGATION     left    Prior to Admission medications   Medication Sig Start Date End Date Taking? Authorizing Provider  albuterol (VENTOLIN HFA) 108 (90 Base) MCG/ACT inhaler TAKE 2 PUFFS BY MOUTH EVERY 6 HOURS AS NEEDED FOR WHEEZE OR SHORTNESS OF BREATH 07/28/20  Yes Salena Saner, MD  Dihydroergotamine Mesylate HFA (TRUDHESA) 0.725 MG/ACT AERS Place 0.75 mg into the nose as needed (take at the earlies on set of Migraine). 10/19/20  Yes Jaffe, Adam R, DO  IBU 800 MG tablet Take 1 tablet by mouth 2 (two) times daily as needed. 06/01/17  Yes [provider]  montelukast (SINGULAIR) 10 MG tablet TAKE 1 TABLET BY MOUTH EVERYDAY AT BEDTIME 04/29/21  Yes Salena Saner, MD  QULIPTA 60 MG TABS TAKE ONE TABLET BY MOUTH EVERY DAY 04/19/21  Yes Everlena Cooper, Adam R, DO  rizatriptan (MAXALT-MLT) 10 MG disintegrating  tablet Take 1 tablet (10 mg total) by mouth as needed for migraine. 01/04/21  Yes Jaffe, Adam R, DO  nortriptyline (PAMELOR) 50 MG capsule TAKE 1 CAPSULE BY MOUTH EVERYDAY AT BEDTIME Patient not taking: Reported on 05/17/2021 03/26/21   Drema Dallas, DO  ondansetron (ZOFRAN) 4 MG tablet Take 1 tablet (4 mg total) by mouth every 8 (eight) hours as needed for nausea or vomiting. Patient not taking: Reported on 05/17/2021 10/23/17   Drema Dallas, DO  predniSONE (STERAPRED UNI-PAK 21 TAB) 10 MG (21) TBPK tablet take 60mg  day 1, then 50mg  day 2, then 40mg  day 3, then 30mg  day 4, then 20mg  day 5, then 10mg  day 6, then STOP Patient not taking: No sig reported 10/20/20   , DO    Allergies Latex  Family History  Problem Relation Age of Onset   Healthy Mother    Migraines Father    Healthy Brother    Healthy Child    Breast cancer Neg Hx     Social History Social History   Tobacco Use   Smoking status: Former    Types: Cigars    Quit date: 08/22/1997    Years since quitting: 23.7   Smokeless tobacco: Never   Tobacco comments:    not a daily smoker.  Vaping Use   Vaping Use: Never used  Substance Use Topics   Alcohol  use: No   Drug use: Never    Review of Systems  Constitutional: No fever/chills Eyes: No visual changes. ENT: No sore throat. Cardiovascular: Denies chest pain. Respiratory: Denies shortness of breath. Gastrointestinal: No nausea, no vomiting.  No diarrhea.  Genitourinary: Negative for dysuria.  Musculoskeletal: Negative for back pain. Skin: Negative for rash. Neurological: Negative for headaches, focal weakness or numbness.   ____________________________________________   PHYSICAL EXAM:  VITAL SIGNS: ED Triage Vitals  Enc Vitals Group     BP 05/17/21 0650 (!) 144/98     Pulse Rate 05/17/21 0650 70     Resp 05/17/21 0650 19     Temp 05/17/21 0650 98.6 F (37 C)     Temp Source 05/17/21 0650 Oral     SpO2 05/17/21 0650 97 %     Weight 05/17/21  0651 187 lb (84.8 kg)     Height 05/17/21 0651 5\' 5"  (1.651 m)     Head Circumference --      Peak Flow --      Pain Score 05/17/21 0650 10     Pain Loc --      Pain Edu? --      Excl. in GC? --     Constitutional: Alert and oriented. Well appearing and in no acute distress. Eyes: Conjunctivae are normal.  No scleral icterus. Head: Atraumatic. Nose: No congestion/rhinnorhea. Mouth/Throat: Mucous membranes are moist.   Neck: Normal range of motion.  Cardiovascular: Normal rate, regular rhythm. Good peripheral circulation. Respiratory: Normal respiratory effort.  No retractions. Gastrointestinal: Soft with mild left lower quadrant/suprapubic tenderness.  No distention.  Genitourinary: No flank tenderness.  Normal external genitalia.  No significant discharge.  No CMT.  Minimal left adnexal tenderness. Musculoskeletal: No lower extremity edema.  Extremities warm and well perfused.  Neurologic:  Normal speech and language. No gross focal neurologic deficits are appreciated.  Skin:  Skin is warm and dry. No rash noted. Psychiatric: Mood and affect are normal. Speech and behavior are normal.  ____________________________________________   LABS (all labs ordered are listed, but only abnormal results are displayed)  Labs Reviewed  WET PREP, GENITAL - Abnormal; Notable for the following components:      Result Value   WBC, Wet Prep HPF POC FEW (*)    All other components within normal limits  COMPREHENSIVE METABOLIC PANEL - Abnormal; Notable for the following components:   Glucose, Bld 101 (*)    Calcium 8.7 (*)    All other components within normal limits  URINALYSIS, COMPLETE (UACMP) WITH MICROSCOPIC - Abnormal; Notable for the following components:   Color, Urine YELLOW (*)    APPearance CLEAR (*)    Hgb urine dipstick SMALL (*)    Bacteria, UA RARE (*)    All other components within normal limits  CHLAMYDIA/NGC RT PCR ([facility] ONLY)            LIPASE, BLOOD  CBC  PREGNANCY,  URINE   ____________________________________________  EKG   ____________________________________________  RADIOLOGY  CT abdomen/pelvis: No acute abnormality.  Small amount of free fluid in the pelvis. 05/19/21 pelvis: No acute abnormality  ____________________________________________   PROCEDURES  Procedure(s) performed: No  Procedures  Critical Care performed: No ____________________________________________   INITIAL IMPRESSION / ASSESSMENT AND PLAN / ED COURSE  Pertinent labs & imaging results that were available during my care of the patient were reviewed by me and considered in my medical decision making (see chart for details).   45 year old female with PMH as  noted above presents with left lower abdominal pain over the last few days with no significant associated symptoms.  On exam she is overall well-appearing.  Her vital signs are normal except for mild hypertension.  The abdomen is soft with mild left lower quadrant/left suprapubic tenderness.  Differential includes ovarian cyst rupture, mittelschmerz, PID, UTI/cystitis, or less likely colitis or diverticulitis.  We will obtain labs, pelvic ultrasound, and reassess.  If there are no significant findings on pelvic ultrasound the patient may need additional imaging.  ----------------------------------------- 12:44 PM on 05/17/2021 -----------------------------------------  Ultrasound shows no significant abnormalities.  I proceeded with CT to further evaluate abdominal structures.  This was also negative although there is a small amount of free fluid in the pelvis.  On reassessment, the patient appears comfortable.  She has not required anything else for pain beside a dose of ibuprofen.  Overall I suspect benign etiology such as possible small ovarian cyst that ruptured a few days ago, passed ureteral stone, or muscular abdominal wall pain.  The GC/CT swab was apparently incorrectly collected, however the patient has no  CMT or discharge and no clinical evidence of cervicitis or PID or high risk sexual history.  Given the reassuring work-up and the patient's well appearance, there is no indication for further ED observation.  She is stable for discharge home.  I counseled her on the results of the work-up and plan of care.  Return precautions given, and she expressed understanding.  ____________________________________________   FINAL CLINICAL IMPRESSION(S) / ED DIAGNOSES  Final diagnoses:  Pelvic pain  Left lower quadrant abdominal pain      NEW MEDICATIONS STARTED DURING THIS VISIT:  New Prescriptions   No medications on file     Note:  This document was prepared using Dragon voice recognition software and may include unintentional dictation errors.    Dionne Bucy, MD 05/17/21 1245    Dionne Bucy, MD 05/17/21 1247

## 2021-05-17 NOTE — ED Notes (Signed)
Rn to bedside to introduce self to pt. Pt resting comfortable. Pt states "I think I have a cyst on my ovary". Pt denies having a regular OBGYN.

## 2021-05-17 NOTE — ED Triage Notes (Signed)
Patient reports left sided abdominal pain for 3 days.

## 2021-05-17 NOTE — Discharge Instructions (Addendum)
Return to the ER for new, worsening, or persistent severe abdominal pain, vomiting, fever, weakness, or any other new or worsening symptoms that concern you.  You may continue take ibuprofen or Tylenol as needed for the pain.

## 2021-05-31 ENCOUNTER — Telehealth: Payer: Self-pay

## 2021-05-31 NOTE — Telephone Encounter (Signed)
New message   Norma Dunn Key: [id number] - PA Case ID: [id number] Need help? Call us at [phone] Status Sent to [employer] [facility] Qulipta 60MG  tablets Form [employer] Electronic PA Form [phone] NCPDP)

## 2021-06-01 NOTE — Telephone Encounter (Signed)
F/u   [facility] Key: [id number] - PA Case ID: [id number] Need help? Call us at [phone]  Outcome Denied on October 10  Your PA request has been denied. Additional information will be provided in the denial communication. (Message [phone])  Drug Qulipta 60MG  tablets

## 2021-06-09 ENCOUNTER — Other Ambulatory Visit: Payer: Self-pay | Admitting: Family Medicine

## 2021-06-09 DIAGNOSIS — Z1231 Encounter for screening mammogram for malignant neoplasm of breast: Secondary | ICD-10-CM

## 2021-06-15 ENCOUNTER — Telehealth: Payer: Self-pay

## 2021-06-15 NOTE — Telephone Encounter (Signed)
New message   [employer] is unable to respond with clinical questions. Please see more information at the bottom of the page for next steps.  Azlyn Cottrill Key: [id number] help? Call us at [phone] Outcome Additional Information Required A PA is already in process for this member/drug. For further inquiries please contact the number on the back of the member prescription card. (Message [phone]) Drug Qulipta 60MG  tablets Form [employer] Electronic PA Form [phone] NCPDP)

## 2021-06-22 NOTE — Telephone Encounter (Signed)
F/u    Information regarding your request A PA is already in process for this member/drug. For further inquiries please contact the number on the back of the member prescription card. (Message [phone])

## 2021-06-22 NOTE — Telephone Encounter (Signed)
F/u   [employer] is unable to respond with clinical questions. Please see more information at the bottom of the page for next steps.

## 2021-06-25 ENCOUNTER — Other Ambulatory Visit: Payer: Self-pay

## 2021-06-25 ENCOUNTER — Ambulatory Visit
Admission: RE | Admit: 2021-06-25 | Discharge: 2021-06-25 | Disposition: A | Discharge status: Home / Self Care | Payer: Federal, State, Local not specified - PPO | Source: Ambulatory Visit | Attending: Family Medicine | Admitting: Family Medicine

## 2021-06-25 DIAGNOSIS — Z1231 Encounter for screening mammogram for malignant neoplasm of breast: Secondary | ICD-10-CM | POA: Diagnosis present

## 2021-06-29 NOTE — Telephone Encounter (Signed)
Patient approved for Qulipta complete.   Pt needs to have an appeal posted at 180 days through her insurance.

## 2021-06-30 ENCOUNTER — Telehealth: Payer: Self-pay | Admitting: Neurology

## 2021-06-30 NOTE — Telephone Encounter (Signed)
Waiting on Dr.Jaffe to sign the script. will fax over once signed.

## 2021-07-01 ENCOUNTER — Other Ambulatory Visit: Payer: Self-pay | Admitting: Neurology

## 2021-07-05 ENCOUNTER — Telehealth: Payer: Self-pay | Admitting: Neurology

## 2021-07-05 NOTE — Telephone Encounter (Signed)
Pt called in stating she has about 4 Qulipta pills left and she got a letter in the mail that her insurance is not going to cover it. She is not sure what she should do?

## 2021-07-05 NOTE — Telephone Encounter (Signed)
Advised pt she should be  okay. Script was sent to [facility].  Dr.Jaffe to sign refill request.   If patient hasn't received her medication please call the office to get samples.

## 2021-07-09 ENCOUNTER — Telehealth: Payer: Self-pay | Admitting: Neurology

## 2021-07-09 MED ORDER — QULIPTA 60 MG PO TABS: 1.0000 | ORAL_TABLET | Freq: Every day | ORAL | 5 refills | Status: DC

## 2021-07-09 NOTE — Telephone Encounter (Signed)
Tresa Endo from [facility] called, pt was approved for qulipta through bridge program. They need an RX sent

## 2021-07-09 NOTE — Telephone Encounter (Signed)
Qulipta sent to [facility].

## 2021-07-09 NOTE — Telephone Encounter (Signed)
Medication Samples have been provided to the patient.  Drug name: Leonides Grills       Strength: 60 mg        Qty: 5  LOT: (1) [id number] (4) [id number]  Exp.Date: 06/2022  Dosing instructions: daily  The patient has been instructed regarding the correct time, dose, and frequency of taking this medication, including desired effects and most common side effects.   Leida Lauth 3:24 PM 07/09/2021

## 2021-07-09 NOTE — Telephone Encounter (Signed)
Pt needs samples of Qulpita if possible. Can pick up, just wants to know when

## 2021-07-09 NOTE — Telephone Encounter (Signed)
Pt advised of assistance approved for Qulipta.  Pt okay to pick up samples until the company mails her out.

## 2021-07-29 ENCOUNTER — Ambulatory Visit: Payer: Federal, State, Local not specified - PPO | Admitting: Obstetrics and Gynecology

## 2021-07-29 ENCOUNTER — Encounter: Payer: Self-pay | Admitting: Obstetrics and Gynecology

## 2021-07-29 ENCOUNTER — Other Ambulatory Visit: Payer: Self-pay

## 2021-07-29 VITALS — BP 123/87 | HR 54 | Resp 16 | Ht 65.0 in | Wt 193.3 lb

## 2021-07-29 DIAGNOSIS — N951 Menopausal and female climacteric states: Secondary | ICD-10-CM | POA: Diagnosis not present

## 2021-07-29 DIAGNOSIS — R1032 Left lower quadrant pain: Secondary | ICD-10-CM

## 2021-07-29 MED ORDER — ESTRADIOL-NORETHINDRONE ACET 1-0.5 MG PO TABS: 1.0000 | ORAL_TABLET | Freq: Every day | ORAL | 0 refills | Status: DC

## 2021-07-29 NOTE — Progress Notes (Addendum)
HPI:      [name] is a 45 y.o. No obstetric history on file. who LMP was Patient's last menstrual period was 04/09/2018 (exact date).  Subjective:   She presents today with complaint of approximately 3 months history of intermittent left lower quadrant abdominal pain.  She reports it is worse with activities.  It is not disabling.  Patient has had a significant work-up including ultrasound and CT.  These proved to be negative. Of significant note, patient is known to be in menopause and states that she had a blood test proving it.  Her last menstrual period was approximately 9 months ago. Patient has also recently begun experiencing constipation.  This is new for her. She previously had a right oophorectomy and tubal removal.  She also has a tubal ligation for birth control.    Hx: The following portions of the patient's history were reviewed and updated as appropriate:             She  has a past medical history of Asthma and Headache. She does not have a problem list on file. She  has a past surgical history that includes Oophorectomy (2005); Tubal ligation; and Tubal ligation. Her family history includes Healthy in her brother, child, and mother; Migraines in her father. She  reports that she quit smoking about 23 years ago. Her smoking use included cigars. She has never used smokeless tobacco. She reports that she does not drink alcohol and does not use drugs. She has a current medication list which includes the following prescription(s): albuterol, qulipta, trudhesa, ibu, montelukast, ondansetron, and rizatriptan. She is allergic to latex.       Review of Systems:  Review of Systems  Constitutional: Denied constitutional symptoms, night sweats, recent illness, fatigue, fever, insomnia and weight loss.  Eyes: Denied eye symptoms, eye pain, photophobia, vision change and visual disturbance.  Ears/Nose/Throat/Neck: Denied ear, nose, throat or neck symptoms, hearing loss, nasal  discharge, sinus congestion and sore throat.  Cardiovascular: Denied cardiovascular symptoms, arrhythmia, chest pain/pressure, edema, exercise intolerance, orthopnea and palpitations.  Respiratory: Denied pulmonary symptoms, asthma, pleuritic pain, productive sputum, cough, dyspnea and wheezing.  Gastrointestinal: See HPI for additional information.  Genitourinary: See HPI for additional information.  Musculoskeletal: Denied musculoskeletal symptoms, stiffness, swelling, muscle weakness and myalgia.  Dermatologic: Denied dermatology symptoms, rash and scar.  Neurologic: Denied neurology symptoms, dizziness, headache, neck pain and syncope.  Psychiatric: Denied psychiatric symptoms, anxiety and depression.  Endocrine: Denied endocrine symptoms including hot flashes and night sweats.   Meds:   Current Outpatient Medications on File Prior to Visit  Medication Sig Dispense Refill   albuterol (VENTOLIN HFA) 108 (90 Base) MCG/ACT inhaler TAKE 2 PUFFS BY MOUTH EVERY 6 HOURS AS NEEDED FOR WHEEZE OR SHORTNESS OF BREATH 18 each 6   Atogepant (QULIPTA) 60 MG TABS Take 1 tablet by mouth daily. 30 tablet 5   Dihydroergotamine Mesylate HFA (TRUDHESA) 0.725 MG/ACT AERS Place 0.75 mg into the nose as needed (take at the earlies on set of Migraine). 2 mL 0   IBU 800 MG tablet Take 1 tablet by mouth 2 (two) times daily as needed.     montelukast (SINGULAIR) 10 MG tablet TAKE 1 TABLET BY MOUTH EVERYDAY AT BEDTIME 90 tablet 3   ondansetron (ZOFRAN) 4 MG tablet Take 1 tablet (4 mg total) by mouth every 8 (eight) hours as needed for nausea or vomiting. 20 tablet 3   rizatriptan (MAXALT-MLT) 10 MG disintegrating tablet TAKE 1 TABLET  BY MOUTH AS NEEDED FOR MIGRAINE 9 tablet 3   No current facility-administered medications on file prior to visit.      Objective:     Vitals:   07/29/21 1344  BP: 123/87  Pulse: (!) 54  Resp: 16   Filed Weights   07/29/21 1344  Weight: 193 lb 4.8 oz (87.7 kg)               Abdominal examination reveals pain in the left lower quadrant possibly consistent with a small hernia.  No definitive hernia noted.  Pain is worse with Valsalva maneuver and abdominal wall palpation.  Pain occurs with slight indentation of the abdomen.          Assessment:    No obstetric history on file. There are no problems to display for this patient.    1. Left lower quadrant abdominal pain   2. 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  Strongly doubt left ovary.  Ultrasound and CT showed no evidence of pathology in the pelvis.   Plan:            1.  Because of symptomatic menopause and the patient's young age we have discussed HRT.  She would like to begin HRT.  We will start Activella.  2.  She will undergo expectant management of her left lower quadrant pain as it does occur intermittently.  In 3 months when she follows up for her HRT we will again discuss her pain.  Consideration to referral for general surgery if necessary.  3.  Advised use of fiber laxatives like Citrucel to keep stools soft and deal with constipation issues.  Orders No orders of the defined types were placed in this encounter.   No orders of the defined types were placed in this encounter.     F/U  Return in about 3 months (around 10/27/2021). I spent 32 minutes involved in the care of this patient preparing to see the patient by obtaining and reviewing her medical history (including labs, imaging tests and prior procedures), documenting clinical information in the electronic health record (EHR), counseling and coordinating care plans, writing and sending prescriptions, ordering tests or procedures and in direct communicating with the patient and medical staff discussing pertinent items from her history and physical exam.  Elonda Husky, M.D. 07/29/2021 2:07 PM

## 2021-08-04 ENCOUNTER — Encounter: Payer: Self-pay | Admitting: Obstetrics and Gynecology

## 2021-08-25 ENCOUNTER — Telehealth: Payer: Self-pay | Admitting: Neurology

## 2021-08-25 NOTE — Telephone Encounter (Signed)
Telephone call to patient, Norma Dunn to pick up samples.   Medication Samples have been provided to the patient.  Drug name: Lenoria Chime       Strength: 60 mg        Qty: 4  LOT: [id number]  Exp.Date: 06/2023  Dosing instructions: Daily  The patient has been instructed regarding the correct time, dose, and frequency of taking this medication, including desired effects and most common side effects.   Venetia Night 8:17 AM 08/25/2021

## 2021-08-25 NOTE — Telephone Encounter (Signed)
Pt called in stating she is having trouble getting her prescription for Qulipta. She is wondering if she can get some samples until she can get her prescription?

## 2021-09-07 NOTE — Progress Notes (Signed)
NEUROLOGY FOLLOW UP OFFICE NOTE  Norma Dunn [id number]  Assessment/Plan:   Migraine without aura, without status migrainosus, not intractable   Migraine prevention:  Qulipta 60mg  daily Migraine rescue:  rizatriptan 10mg ; Zofran for nausea Limit use of pain relievers to no more than 2 days out of week to prevent risk of rebound or medication-overuse headache. Keep headache diary Follow up 1 year     Subjective:  Norma Dunn is a 46 year old woman who follows up for migraines.   UPDATE: Intensity:  moderate Duration:  1 to 2 hours Frequency:  No more than once a month Current NSAIDS:  no Current analgesics:  no Current triptans:  rizatriptan 10mg  Current anti-emetic:  Zofran 4mg  Current muscle relaxants:  no Current anti-anxiolytic:  no Current sleep aide:  no Current Antihypertensive medications:  no Current Antidepressant medications:  no Current Anticonvulsant medications:  no Current CGRP inhibitor:  Qulipta 60mg  QD Current Vitamins/Herbal/Supplements:  no Current Antihistamines/Decongestants:  meclizine Other therapy:  no   Caffeine:  rarely Alcohol:  no Smoker:  no Diet:  Hydrates.  Rarely soda.  Watches diet. Exercise:  yes Depression/anxiety:  No Other pain:  no Sleep hygiene:  Improved.     HISTORY: Onset:  Since her 60s Location:  Usually bi-frontal or either side Quality:  Pressure, sometimes sharp Initial Intensity:  Moderate to severe Aura:  no Prodrome:  forgetful Postdrome:  Sometimes fatigue Associated symptoms:  Nausea, photophobia, phonophobia, blurred vision in left eye, no vomiting.  She has not had any new worse headache of her life, waking up from sleep Initial Duration:  Usually 2 days (but up to a week) Initial Frequency:  Varies.  Some months up to 18 days per month but she may have 1 or 2 months with little or no headache. Initial Frequency of abortive medication: as needed. Triggers:  Menstrual cycle, stress Relieving  factors:  Nothing Activity:  Aggravates.  Cannot function or go to work 5 days a month.   MRI of brain to evaluate headache from 09/21/04 revealed borderline Chiari I malformation (does not state distance of distension below foramen magnum) and mild fullness of the pituitary but otherwise unremarkable.  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    Past NSAIDS:  Ibuprofen 800mg  Past analgesics:  Excedrin, Fioricet Past abortive triptans:  Sumatriptan (ineffective) Past ergot:  Trudhesa NS Past muscle relaxants:  no Past anti-emetic:  Reglan (effective), Promethazine 25mg  (effective but causes drowsiness) Past antihypertensive medications:  no Past antidepressant medications:  nortriptyline (elevated blood pressure) Past anticonvulsant medications:  no Past CGRP inhibitor:  Nurtec (rescue) Past vitamins/Herbal/Supplements:  no Other past therapies:  no   Family history of headache:  Father (migraines)   She started having low back pain radiating down the left leg in December 2020.  She saw orthopedics.  MRI of lumbar spine on 11/20/2019 showed mild broad-based disc bulge with mild bilateral foraminal narrowing and bilateral L5 pars interarticularis defects causing  grade 1 anterolisthesis of L5 on S1. Underwent physical therapy  PAST MEDICAL HISTORY: Past Medical History:  Diagnosis Date   Asthma    Headache     MEDICATIONS: Current Outpatient Medications on File Prior to Visit  Medication Sig Dispense Refill   albuterol (VENTOLIN HFA) 108 (90 Base) MCG/ACT inhaler TAKE 2 PUFFS BY MOUTH EVERY 6 HOURS  AS NEEDED FOR WHEEZE OR SHORTNESS OF BREATH 18 each 6   Atogepant (QULIPTA) 60 MG TABS Take 1 tablet by mouth daily. 30 tablet 5   Dihydroergotamine Mesylate HFA (TRUDHESA) 0.725 MG/ACT AERS Place 0.75 mg  into the nose as needed (take at the earlies on set of Migraine). 2 mL 0   estradiol-norethindrone (ACTIVELLA) 1-0.5 MG tablet Take 1 tablet by mouth daily. 90 tablet 0   IBU 800 MG tablet Take 1 tablet by mouth 2 (two) times daily as needed.     montelukast (SINGULAIR) 10 MG tablet TAKE 1 TABLET BY MOUTH EVERYDAY AT BEDTIME 90 tablet 3   ondansetron (ZOFRAN) 4 MG tablet Take 1 tablet (4 mg total) by mouth every 8 (eight) hours as needed for nausea or vomiting. 20 tablet 3   rizatriptan (MAXALT-MLT) 10 MG disintegrating tablet TAKE 1 TABLET BY MOUTH AS NEEDED FOR MIGRAINE 9 tablet 3   No current facility-administered medications on file prior to visit.    ALLERGIES: Allergies  Allergen Reactions   Latex Itching    FAMILY HISTORY: Family History  Problem Relation Age of Onset   Healthy Mother    Migraines Father    Healthy Brother    Healthy Child    Breast cancer Neg Hx       Objective:  Blood pressure 125/84, pulse 62, height 5\' 5"  (1.651 m), weight 189 lb 12.8 oz (86.1 kg), SpO2 99 %. General: No acute distress.  Patient appears well-groomed.   Head:  Normocephalic/atraumatic Eyes:  Fundi examined but not visualized Neck: supple, no paraspinal tenderness, full range of motion Heart:  Regular rate and rhythm Lungs:  Clear to auscultation bilaterally Back: No paraspinal tenderness Neurological Exam: alert and oriented to person, place, and time.  Speech fluent and not dysarthric, language intact.  CN II-XII intact. Bulk and tone normal, muscle strength 5/5 throughout.  Sensation to light touch intact.  Deep tendon reflexes 2+ throughout, toes downgoing.  Finger to nose testing intact.  Gait normal, Romberg negative.   , DO

## 2021-09-14 ENCOUNTER — Encounter: Payer: Self-pay | Admitting: Neurology

## 2021-09-14 ENCOUNTER — Ambulatory Visit: Payer: Federal, State, Local not specified - PPO | Admitting: Neurology

## 2021-09-14 ENCOUNTER — Other Ambulatory Visit: Payer: Self-pay

## 2021-09-14 VITALS — BP 125/84 | HR 62 | Ht 65.0 in | Wt 189.8 lb

## 2021-09-14 DIAGNOSIS — G43009 Migraine without aura, not intractable, without status migrainosus: Secondary | ICD-10-CM | POA: Diagnosis not present

## 2021-09-14 DIAGNOSIS — Z0279 Encounter for issue of other medical certificate: Secondary | ICD-10-CM

## 2021-09-14 NOTE — Patient Instructions (Signed)
Continue Qulipta 60mg  daily Rizatriptan as needed.  Limit use of pain relievers to no more than 2 days out of week to prevent risk of rebound or medication-overuse headache. Keep headache diary Follow up one year

## 2021-10-05 ENCOUNTER — Ambulatory Visit: Payer: Federal, State, Local not specified - PPO | Admitting: Neurology

## 2021-10-18 ENCOUNTER — Other Ambulatory Visit: Payer: Self-pay | Admitting: Obstetrics and Gynecology

## 2021-10-18 DIAGNOSIS — N951 Menopausal and female climacteric states: Secondary | ICD-10-CM

## 2021-10-27 ENCOUNTER — Encounter: Payer: Self-pay | Admitting: Obstetrics and Gynecology

## 2021-10-27 ENCOUNTER — Other Ambulatory Visit: Payer: Self-pay

## 2021-10-27 ENCOUNTER — Ambulatory Visit: Payer: Federal, State, Local not specified - PPO | Admitting: Obstetrics and Gynecology

## 2021-10-27 VITALS — BP 115/81 | HR 67 | Ht 65.0 in | Wt 194.5 lb

## 2021-10-27 DIAGNOSIS — N951 Menopausal and female climacteric states: Secondary | ICD-10-CM | POA: Diagnosis not present

## 2021-10-27 NOTE — Progress Notes (Signed)
Patient presents today for 3 month follow-up regarding HRT. She states she is very happy with her HRT medication and states it eases her migraines. Patient does report pain that last two weeks prior to her last period, states she felt like her "insides were on fire". She states using ibuprofen which did not help. Patient states no other questions or concerns at this time. ?

## 2021-10-27 NOTE — Progress Notes (Signed)
HPI: ?     [name] is a 46 y.o. [phone] who LMP was Patient's last menstrual period was 10/23/2021 (exact date). ? ?Subjective:  ? ?She presents today stating that she is very happy on her HRT.  She reports it has relieved her headaches and she generally feels better. ?The exception is that she had approximately 2 weeks of abdominal pain and breast tenderness.  Both of these have now resolved.  She also had approximately 4 days of vaginal bleeding.  This too has resolved. ?She reports that she is due for her Pap smear mammogram blood work etc. ? ?  Hx: ?The following portions of the patient's history were reviewed and updated as appropriate: ?            She  has a past medical history of Asthma and Headache. ?She does not have a problem list on file. ?She  has a past surgical history that includes Oophorectomy (2005); Tubal ligation; and Tubal ligation. ?Her family history includes Healthy in her brother, child, and mother; Migraines in her father. ?She  reports that she quit smoking about 24 years ago. Her smoking use included cigars. She has never used smokeless tobacco. She reports that she does not drink alcohol and does not use drugs. ?She has a current medication list which includes the following prescription(s): albuterol, qulipta, trudhesa, ibu, mimvey, montelukast, ondansetron, and rizatriptan. ?She is allergic to latex. ?      ?Review of Systems:  ?Review of Systems ? ?Constitutional: Denied constitutional symptoms, night sweats, recent illness, fatigue, fever, insomnia and weight loss.  ?Eyes: Denied eye symptoms, eye pain, photophobia, vision change and visual disturbance.  ?Ears/Nose/Throat/Neck: Denied ear, nose, throat or neck symptoms, hearing loss, nasal discharge, sinus congestion and sore throat.  ?Cardiovascular: Denied cardiovascular symptoms, arrhythmia, chest pain/pressure, edema, exercise intolerance, orthopnea and palpitations.  ?Respiratory: Denied pulmonary symptoms, asthma,  pleuritic pain, productive sputum, cough, dyspnea and wheezing.  ?Gastrointestinal: Denied, gastro-esophageal reflux, melena, nausea and vomiting.  ?Genitourinary: Denied genitourinary symptoms including symptomatic vaginal discharge, pelvic relaxation issues, and urinary complaints.  ?Musculoskeletal: Denied musculoskeletal symptoms, stiffness, swelling, muscle weakness and myalgia.  ?Dermatologic: Denied dermatology symptoms, rash and scar.  ?Neurologic: Denied neurology symptoms, dizziness, headache, neck pain and syncope.  ?Psychiatric: Denied psychiatric symptoms, anxiety and depression.  ?Endocrine: Denied endocrine symptoms including hot flashes and night sweats.  ? ?Meds: ?  ?Current Outpatient Medications on File Prior to Visit  ?Medication Sig Dispense Refill  ? albuterol (VENTOLIN HFA) 108 (90 Base) MCG/ACT inhaler TAKE 2 PUFFS BY MOUTH EVERY 6 HOURS AS NEEDED FOR WHEEZE OR SHORTNESS OF BREATH 18 each 6  ? Atogepant (QULIPTA) 60 MG TABS Take 1 tablet by mouth daily. 30 tablet 5  ? Dihydroergotamine Mesylate HFA (TRUDHESA) 0.725 MG/ACT AERS Place 0.75 mg into the nose as needed (take at the earlies on set of Migraine). 2 mL 0  ? IBU 800 MG tablet Take 1 tablet by mouth 2 (two) times daily as needed.    ? MIMVEY 1-0.5 MG tablet TAKE 1 TABLET BY MOUTH EVERY DAY 84 tablet 1  ? montelukast (SINGULAIR) 10 MG tablet TAKE 1 TABLET BY MOUTH EVERYDAY AT BEDTIME 90 tablet 3  ? ondansetron (ZOFRAN) 4 MG tablet Take 1 tablet (4 mg total) by mouth every 8 (eight) hours as needed for nausea or vomiting. 20 tablet 3  ? rizatriptan (MAXALT-MLT) 10 MG disintegrating tablet TAKE 1 TABLET BY MOUTH AS NEEDED FOR MIGRAINE 9 tablet 3  ? ?  No current facility-administered medications on file prior to visit.  ? ? ? ? ?Objective:  ?  ? ?Vitals:  ? 10/27/21 0846  ?BP: 115/81  ?Pulse: 67  ? ?Filed Weights  ? 10/27/21 0846  ?Weight: 194 lb 8 oz (88.2 kg)  ? ?  ?          ?        ? ?Assessment:  ?  ?[id number] ?There are no problems to  display for this patient. ? ?  ?1. Symptomatic menopausal or female climacteric states   ? ? Patient very happy on HRT.  She would like to continue. ? ? ?Plan:  ?  ?       ? 1.  Continue HRT we will follow her closely because 1 episode of vaginal bleeding.  If she has any additional episodes she is to report them and we will consider further work-up at that time. ? 2.  Follow-up in 3 months for annual examination and HRT follow-up. ?Orders ?No orders of the defined types were placed in this encounter. ? ? No orders of the defined types were placed in this encounter. ?  ?  F/U ? Return in about 3 months (around 01/27/2022) for Annual Physical. ?I spent 22 minutes involved in the care of this patient preparing to see the patient by obtaining and reviewing her medical history (including labs, imaging tests and prior procedures), documenting clinical information in the electronic health record (EHR), counseling and coordinating care plans, writing and sending prescriptions, ordering tests or procedures and in direct communicating with the patient and medical staff discussing pertinent items from her history and physical exam. ? ?Elonda Husky, M.D. ?10/27/2021 ?9:18 AM ? ? ? ? ?

## 2021-11-11 ENCOUNTER — Telehealth: Payer: Self-pay | Admitting: Neurology

## 2021-11-11 NOTE — Telephone Encounter (Signed)
Patient called and needs paperwork re done. Please call so she can explain what needs to be done. ?

## 2021-11-15 NOTE — Telephone Encounter (Signed)
Per Pt please resend FMLA paperwork. ?FMLA resent with Question 2 updated.  ?

## 2021-11-25 ENCOUNTER — Other Ambulatory Visit: Payer: Self-pay | Admitting: Neurology

## 2021-11-29 ENCOUNTER — Telehealth: Payer: Self-pay

## 2021-11-29 NOTE — Telephone Encounter (Signed)
Per Pt her job asking to please fill out #4 on the third page please. And resend ?

## 2021-12-15 ENCOUNTER — Ambulatory Visit: Admit: 2021-12-15 | Payer: Federal, State, Local not specified - PPO

## 2022-01-12 ENCOUNTER — Telehealth: Payer: Self-pay

## 2022-01-12 NOTE — Telephone Encounter (Signed)
Per Lenoria Chime Appeal needed for [employer] stops 02/2022.

## 2022-01-13 ENCOUNTER — Telehealth: Payer: Self-pay | Admitting: Neurology

## 2022-01-13 ENCOUNTER — Telehealth (HOSPITAL_COMMUNITY): Payer: Self-pay | Admitting: Pharmacy Technician

## 2022-01-13 ENCOUNTER — Other Ambulatory Visit (HOSPITAL_COMMUNITY): Payer: Self-pay

## 2022-01-13 MED ORDER — QULIPTA 60 MG PO TABS: 1.0000 | ORAL_TABLET | Freq: Every day | ORAL | 5 refills | Status: DC

## 2022-01-13 NOTE — Telephone Encounter (Signed)
Patient Advocate Encounter  Prior Authorization for Qulipta 60MG  tablets has been approved.    PA# Effective dates: 01/13/2022 through 01/13/2023      01/15/2023, CPh[facility] Pharmacy Patient Advocate Specialist [facility] Patient Advocate Team Direct Number: [phone]  Fax: [phone]

## 2022-01-13 NOTE — Telephone Encounter (Signed)
Forms faxed off for patient at the fax number given.

## 2022-01-13 NOTE — Telephone Encounter (Signed)
Paperwork updated 12/12/21. Advised patient will resend that update again. Patient to call back with fax number.

## 2022-01-13 NOTE — Telephone Encounter (Signed)
Patient is returning a call to sheena ?

## 2022-01-13 NOTE — Telephone Encounter (Signed)
Patient Advocate Encounter   Received notification that prior authorization for Qulipta 60MG  tablets is required.   PA submitted on 01/13/2022 Key [id number] Status is pending       01/15/2022, CPhT Pharmacy Patient Advocate Specialist [facility] Patient Advocate Team Direct Number: [phone]  Fax: [phone]

## 2022-01-13 NOTE — Telephone Encounter (Signed)
Pt called in stating paperwork that was filled out previously was missing some information. She would like a call back so she can tell us exactly what it needs to say.

## 2022-01-13 NOTE — Telephone Encounter (Signed)
Please see other encounter.

## 2022-01-13 NOTE — Telephone Encounter (Signed)
Patient called back with fax # [phone]

## 2022-01-13 NOTE — Telephone Encounter (Signed)
Prior Authorization for Qulipta 60MG  tablets has been approved.       PA#  Effective dates: 01/13/2022 through 01/13/2023.    New script sent to patient pharmacy.

## 2022-01-13 NOTE — Telephone Encounter (Signed)
LMOVM for patient to call the office back.

## 2022-01-26 ENCOUNTER — Encounter: Payer: Self-pay | Admitting: Obstetrics and Gynecology

## 2022-01-26 ENCOUNTER — Other Ambulatory Visit (HOSPITAL_COMMUNITY)
Admission: RE | Admit: 2022-01-26 | Discharge: 2022-01-26 | Disposition: A | Discharge status: Home / Self Care | Payer: Federal, State, Local not specified - PPO | Source: Ambulatory Visit | Attending: Obstetrics and Gynecology | Admitting: Obstetrics and Gynecology

## 2022-01-26 ENCOUNTER — Ambulatory Visit (INDEPENDENT_AMBULATORY_CARE_PROVIDER_SITE_OTHER): Payer: Federal, State, Local not specified - PPO | Admitting: Obstetrics and Gynecology

## 2022-01-26 VITALS — BP 129/88 | HR 58 | Ht 65.0 in | Wt 193.2 lb

## 2022-01-26 DIAGNOSIS — Z01419 Encounter for gynecological examination (general) (routine) without abnormal findings: Secondary | ICD-10-CM | POA: Diagnosis present

## 2022-01-26 DIAGNOSIS — N951 Menopausal and female climacteric states: Secondary | ICD-10-CM | POA: Diagnosis not present

## 2022-01-26 DIAGNOSIS — Z124 Encounter for screening for malignant neoplasm of cervix: Secondary | ICD-10-CM | POA: Diagnosis present

## 2022-01-26 DIAGNOSIS — Z1231 Encounter for screening mammogram for malignant neoplasm of breast: Secondary | ICD-10-CM | POA: Diagnosis not present

## 2022-01-26 MED ORDER — ESTRADIOL-NORETHINDRONE ACET 1-0.5 MG PO TABS: 1.0000 | ORAL_TABLET | Freq: Every day | ORAL | 3 refills | Status: DC

## 2022-01-26 NOTE — Progress Notes (Signed)
Patients presents for annual exam today. She states no bleeding since March while on Mimvey however has been having lower abdominal pain and cramping since 01/11/22. Patient is due for pap smear, ordered. Patient is up to date with mammogram through PCP. Patients annual labs are ordered. Patient states no other questions or concerns at this time.

## 2022-01-26 NOTE — Progress Notes (Signed)
HPI:      [name] is a 46 y.o. [phone] who LMP was No LMP recorded. (Menstrual status: Perimenopausal).  Subjective:   She presents today for her annual examination.  She likes taking HRT.  She reports no further bleeding. She does state that late in May she began to have a recurrence of her left lower quadrant abdominal pain.  She says that she is convinced it is not a hernia.  She cannot associate with any particular activity.  Ibuprofen does help.    Hx: The following portions of the patient's history were reviewed and updated as appropriate:             She  has a past medical history of Asthma and Headache. She does not have a problem list on file. She  has a past surgical history that includes Oophorectomy (2005); Tubal ligation; and Tubal ligation. Her family history includes Healthy in her brother, child, and mother; Migraines in her father. She  reports that she quit smoking about 24 years ago. Her smoking use included cigars. She has never used smokeless tobacco. She reports that she does not drink alcohol and does not use drugs. She has a current medication list which includes the following prescription(s): albuterol, qulipta, ibu, montelukast, ondansetron, rizatriptan, and estradiol-norethindrone. She is allergic to latex.       Review of Systems:  Review of Systems  Constitutional: Denied constitutional symptoms, night sweats, recent illness, fatigue, fever, insomnia and weight loss.  Eyes: Denied eye symptoms, eye pain, photophobia, vision change and visual disturbance.  Ears/Nose/Throat/Neck: Denied ear, nose, throat or neck symptoms, hearing loss, nasal discharge, sinus congestion and sore throat.  Cardiovascular: Denied cardiovascular symptoms, arrhythmia, chest pain/pressure, edema, exercise intolerance, orthopnea and palpitations.  Respiratory: Denied pulmonary symptoms, asthma, pleuritic pain, productive sputum, cough, dyspnea and wheezing.  Gastrointestinal:  Denied, gastro-esophageal reflux, melena, nausea and vomiting.  Genitourinary: See HPI for additional information.  Musculoskeletal: Denied musculoskeletal symptoms, stiffness, swelling, muscle weakness and myalgia.  Dermatologic: Denied dermatology symptoms, rash and scar.  Neurologic: Denied neurology symptoms, dizziness, headache, neck pain and syncope.  Psychiatric: Denied psychiatric symptoms, anxiety and depression.  Endocrine: Denied endocrine symptoms including hot flashes and night sweats.   Meds:   Current Outpatient Medications on File Prior to Visit  Medication Sig Dispense Refill   albuterol (VENTOLIN HFA) 108 (90 Base) MCG/ACT inhaler TAKE 2 PUFFS BY MOUTH EVERY 6 HOURS AS NEEDED FOR WHEEZE OR SHORTNESS OF BREATH 18 each 6   Atogepant (QULIPTA) 60 MG TABS Take 1 tablet by mouth daily. 30 tablet 5   IBU 800 MG tablet Take 1 tablet by mouth 2 (two) times daily as needed.     montelukast (SINGULAIR) 10 MG tablet TAKE 1 TABLET BY MOUTH EVERYDAY AT BEDTIME 90 tablet 3   ondansetron (ZOFRAN) 4 MG tablet Take 1 tablet (4 mg total) by mouth every 8 (eight) hours as needed for nausea or vomiting. 20 tablet 3   rizatriptan (MAXALT-MLT) 10 MG disintegrating tablet TAKE 1 TABLET BY MOUTH AS NEEDED FOR MIGRAINE. 9 tablet 3   No current facility-administered medications on file prior to visit.     Objective:     Vitals:   01/26/22 0900  BP: 129/88  Pulse: (!) 58    Filed Weights   01/26/22 0900  Weight: 193 lb 3.2 oz (87.6 kg)              Physical examination General NAD, Conversant  HEENT Atraumatic;  Op clear with mmm.  Normo-cephalic. Pupils reactive. Anicteric sclerae  Thyroid/Neck Smooth without nodularity or enlargement. Normal ROM.  Neck Supple.  Skin No rashes, lesions or ulceration. Normal palpated skin turgor. No nodularity.  Breasts: No masses or discharge.  Symmetric.  No axillary adenopathy.  Lungs: Clear to auscultation.No rales or wheezes. Normal Respiratory  effort, no retractions.  Heart: NSR.  No murmurs or rubs appreciated. No periferal edema  Abdomen: Soft.  Non-tender.  No masses.  No HSM.  Left lower quadrant pain in the inguinal area no masses palpated  Extremities: Moves all appropriately.  Normal ROM for age. No lymphadenopathy.  Neuro: Oriented to PPT.  Normal mood. Normal affect.     Pelvic:   Vulva: Normal appearance.  No lesions.  Vagina: No lesions or abnormalities noted.  Support: Normal pelvic support.  Urethra No masses tenderness or scarring.  Meatus Normal size without lesions or prolapse.  Cervix: Normal appearance.  No lesions.  Anus: Normal exam.  No lesions.  Perineum: Normal exam.  No lesions.        Bimanual   Uterus: Normal size.  Non-tender.  Mobile.  AV.  Adnexae: No masses.  Non-tender to palpation.  Cul-de-sac: Negative for abnormality.     Assessment:    [id number] There are no problems to display for this patient.    1. Well woman exam with routine gynecological exam   2. Screening mammogram for breast cancer   3. Cervical cancer screening   4. Symptomatic menopausal or female climacteric states     Patient doing well on HRT.  Has intermittent left lower quadrant abdominal pain for the last 2 weeks.  This is located in the inguinal area possibly consistent with small hernia.  Strongly doubt ovary or uterus.   Plan:            1.  Basic Screening Recommendations The basic screening recommendations for asymptomatic women were discussed with the patient during her visit.  The age-appropriate recommendations were discussed with her and the rational for the tests reviewed.  When I am informed by the patient that another primary care physician has previously obtained the age-appropriate tests and they are up-to-date, only outstanding tests are ordered and referrals given as necessary.  Abnormal results of tests will be discussed with her when all of her results are completed.  Routine preventative health  maintenance measures emphasized: Exercise/Diet/Weight control, Tobacco Warnings, Alcohol/Substance use risks and Stress Management Pap performed-patient gets mammogram through family physician. 2.  Continue HRT 3.  Offered general surgery referral for possible hernia -patient declined.  Offered pelvic ultrasound-patient declined   Orders Orders Placed This Encounter  Procedures   Basic metabolic panel   CBC   TSH   Lipid panel   Hemoglobin A1c     Meds ordered this encounter  Medications   estradiol-norethindrone (MIMVEY) 1-0.5 MG tablet    Sig: Take 1 tablet by mouth daily.    Dispense:  84 tablet    Refill:  3           F/U  Return in about 1 week (around 02/02/2022) for Annual Physical, We will contact her with any abnormal test results.  She will inform us if she has any further episodes of bleeding or would like a more detailed work-up for abdominal pain.  Elonda Husky, M.D. 01/26/2022 9:38 AM

## 2022-01-27 LAB — BASIC METABOLIC PANEL
BUN/Creatinine Ratio: 12 (ref 9–23)
BUN: 11 mg/dL (ref 6–24)
CO2: 23 mmol/L (ref 20–29)
Calcium: 9.1 mg/dL (ref 8.7–10.2)
Chloride: 103 mmol/L (ref 96–106)
Creatinine, Ser: 0.9 mg/dL (ref 0.57–1.00)
Glucose: 84 mg/dL (ref 70–99)
Potassium: 4.2 mmol/L (ref 3.5–5.2)
Sodium: 138 mmol/L (ref 134–144)
eGFR: 80 mL/min/{1.73_m2} (ref >59)

## 2022-01-27 LAB — LIPID PANEL
Chol/HDL Ratio: 2.9 ratio (ref 0.0–4.4)
Cholesterol, Total: 164 mg/dL (ref 100–199)
HDL: 57 mg/dL (ref >39)
LDL Chol Calc (NIH): 96 mg/dL (ref 0–99)
Triglycerides: 52 mg/dL (ref 0–149)
VLDL Cholesterol Cal: 11 mg/dL (ref 5–40)

## 2022-01-27 LAB — CBC
Hematocrit: 38.3 % (ref 34.0–46.6)
Hemoglobin: 12.9 g/dL (ref 11.1–15.9)
MCH: 29.1 pg (ref 26.6–33.0)
MCHC: 33.7 g/dL (ref 31.5–35.7)
MCV: 87 fL (ref 79–97)
Platelets: 269 10*3/uL (ref 150–450)
RBC: 4.43 x10E6/uL (ref 3.77–5.28)
RDW: 13 % (ref 11.7–15.4)
WBC: 6.9 10*3/uL (ref 3.4–10.8)

## 2022-01-27 LAB — HEMOGLOBIN A1C
Est. average glucose Bld gHb Est-mCnc: 117 mg/dL
Hgb A1c MFr Bld: 5.7 % — ABNORMAL HIGH (ref 4.8–5.6)

## 2022-01-27 LAB — TSH: TSH: 1.33 u[IU]/mL (ref 0.450–4.500)

## 2022-01-28 LAB — CYTOLOGY - PAP
Comment: NEGATIVE
Diagnosis: UNDETERMINED — AB
High risk HPV: NEGATIVE

## 2022-01-30 NOTE — Progress Notes (Signed)
Despite the finding of ASCUS, the negative HPV makes this a negative Pap smear.

## 2022-02-09 ENCOUNTER — Telehealth: Payer: Self-pay | Admitting: Pulmonary Disease

## 2022-02-09 NOTE — Telephone Encounter (Signed)
Patient last seen 08/2020.  Appt is needed. Please schedule.

## 2022-02-09 NOTE — Telephone Encounter (Signed)
Noted  

## 2022-02-09 NOTE — Telephone Encounter (Signed)
Spoke to patient and offered appt, as she was last seen over a year ago. Appt scheduled 04/21/2022 C/o chest tightness and sharpe pain with deep breathing and mild dry cough. Sx started this morning. Denied f/c/s or additional sx.  She has not used albuterol inhaler. She feels that she needs nebulized medications.  Recommended that she go to UC or ED to evaluate sx. She voiced her understanding. Nothing further needed.  Routing to Dr. Jayme Cloud as an Lorain Childes.

## 2022-02-09 NOTE — Telephone Encounter (Signed)
I agree with the recommendations given.

## 2022-02-10 ENCOUNTER — Emergency Department
Admission: EM | Admit: 2022-02-10 | Discharge: 2022-02-10 | Disposition: A | Discharge status: Home / Self Care | Payer: Federal, State, Local not specified - PPO | Attending: Emergency Medicine | Admitting: Emergency Medicine

## 2022-02-10 ENCOUNTER — Encounter: Payer: Self-pay | Admitting: Emergency Medicine

## 2022-02-10 ENCOUNTER — Emergency Department: Payer: Federal, State, Local not specified - PPO

## 2022-02-10 DIAGNOSIS — J45909 Unspecified asthma, uncomplicated: Secondary | ICD-10-CM | POA: Insufficient documentation

## 2022-02-10 DIAGNOSIS — R079 Chest pain, unspecified: Secondary | ICD-10-CM | POA: Diagnosis present

## 2022-02-10 DIAGNOSIS — R0602 Shortness of breath: Secondary | ICD-10-CM | POA: Insufficient documentation

## 2022-02-10 LAB — BASIC METABOLIC PANEL
Anion gap: 6 (ref 5–15)
BUN: 13 mg/dL (ref 6–20)
CO2: 23 mmol/L (ref 22–32)
Calcium: 8.9 mg/dL (ref 8.9–10.3)
Chloride: 110 mmol/L (ref 98–111)
Creatinine, Ser: 0.91 mg/dL (ref 0.44–1.00)
GFR, Estimated: >60 mL/min (ref >60)
Glucose, Bld: 107 mg/dL — ABNORMAL HIGH (ref 70–99)
Potassium: 3.6 mmol/L (ref 3.5–5.1)
Sodium: 139 mmol/L (ref 135–145)

## 2022-02-10 LAB — CBC
HCT: 37.1 % (ref 36.0–46.0)
Hemoglobin: 12.1 g/dL (ref 12.0–15.0)
MCH: 28.2 pg (ref 26.0–34.0)
MCHC: 32.6 g/dL (ref 30.0–36.0)
MCV: 86.5 fL (ref 80.0–100.0)
Platelets: 272 10*3/uL (ref 150–400)
RBC: 4.29 MIL/uL (ref 3.87–5.11)
RDW: 12.9 % (ref 11.5–15.5)
WBC: 8.1 10*3/uL (ref 4.0–10.5)
nRBC: 0 % (ref 0.0–0.2)

## 2022-02-10 LAB — TROPONIN I (HIGH SENSITIVITY)
Troponin I (High Sensitivity): <2 ng/L (ref <18)
Troponin I (High Sensitivity): <2 ng/L (ref <18)

## 2022-02-10 LAB — D-DIMER, QUANTITATIVE: D-Dimer, Quant: <0.27 ug/mL-FEU (ref 0.00–0.50)

## 2022-02-10 MED ORDER — IPRATROPIUM-ALBUTEROL 0.5-2.5 (3) MG/3ML IN SOLN
3.0000 mL | Freq: Once | RESPIRATORY_TRACT | Status: AC
  Administered 2022-02-10: 3 mL via RESPIRATORY_TRACT
  Filled 2022-02-10: qty 3

## 2022-02-10 MED ORDER — PREDNISONE 20 MG PO TABS: 60.0000 mg | ORAL_TABLET | Freq: Every day | ORAL | 0 refills | Status: AC

## 2022-02-10 NOTE — ED Triage Notes (Signed)
Pt c/o substernal chest pain and SOB x1 day. Pt has hx/o asthma, has used inhaler at home with no relief.

## 2022-02-10 NOTE — Discharge Instructions (Addendum)

## 2022-02-10 NOTE — ED Notes (Signed)
Pt signed e sig for discharge. Computer stopped working in room.

## 2022-04-12 ENCOUNTER — Other Ambulatory Visit: Payer: Self-pay | Admitting: Pulmonary Disease

## 2022-04-21 ENCOUNTER — Encounter: Payer: Self-pay | Admitting: Pulmonary Disease

## 2022-04-21 ENCOUNTER — Ambulatory Visit: Payer: Federal, State, Local not specified - PPO | Admitting: Pulmonary Disease

## 2022-04-21 VITALS — BP 132/80 | HR 79 | Temp 98.3°F | Ht 64.0 in | Wt 195.8 lb

## 2022-04-21 DIAGNOSIS — R102 Pelvic and perineal pain: Secondary | ICD-10-CM | POA: Insufficient documentation

## 2022-04-21 DIAGNOSIS — Z8616 Personal history of COVID-19: Secondary | ICD-10-CM

## 2022-04-21 DIAGNOSIS — J4520 Mild intermittent asthma, uncomplicated: Secondary | ICD-10-CM

## 2022-04-21 MED ORDER — ALBUTEROL SULFATE HFA 108 (90 BASE) MCG/ACT IN AERS: 2.0000 | INHALATION_SPRAY | Freq: Four times a day (QID) | RESPIRATORY_TRACT | 2 refills | Status: DC | PRN

## 2022-04-21 NOTE — Progress Notes (Signed)
Subjective:    Patient ID: Norma Dunn, female    DOB: 1976/08/15, 46 y.o.   MRN: [id number] Patient Care Team: [facility], [facility] as PCP - General (General Practice) Drema Dallas, DO as Consulting Physician (Neurology)  Chief Complaint  Patient presents with   Follow-up    No current [place].     HPI Patient is a 46 year old very remote former smoker (cigars, quit 1999) who presents for follow-up on mild intermittent asthma without complication.  Patient presents for yearly follow-up.  He is currently maintained on albuterol as needed and Singulair for management of chronic rhinitis changes.  Approximately 2 months ago she had a mild exacerbation and required a visit to the ED.  She received several DuoNeb's and these cleared her symptoms.  She did not require hospitalization.  She was treated with a short prednisone taper and this resolved her symptoms.  Previously she had been having yearly exacerbations.  She is followed at the [facility].  She has not had any recent fevers, chills or sweats.  No cough or sputum production.  She had COVID-19 in January 2022 she did not need n hospitalization at that time.  She never had increased cough or shortness of breath.    She needs her albuterol refilled today.  Review of Systems A 10 point review of systems was performed and it is as noted above otherwise negative.  Past Medical History:  Diagnosis Date   Asthma    Headache    Past Surgical History:  Procedure Laterality Date   OOPHORECTOMY  2005   right   TUBAL LIGATION     left   TUBAL LIGATION     left    Patient Active Problem List   Diagnosis Date Noted   Pelvic pain in female 04/21/2022   Mild intermittent asthma without complication 04/21/2022   Personal history of COVID-19 04/21/2022   Social History   Tobacco Use   Smoking status: Former    Types: Cigars    Quit date: 08/22/1997    Years since quitting: 24.6   Smokeless tobacco:  Never   Tobacco comments:    not a daily smoker.  Substance Use Topics   Alcohol use: No   Allergies  Allergen Reactions   Latex Itching   Current Meds  Medication Sig   albuterol (VENTOLIN HFA) 108 (90 Base) MCG/ACT inhaler Inhale 2 puffs into the lungs every 6 (six) hours as needed for wheezing or shortness of breath.   Atogepant (QULIPTA) 60 MG TABS Take 1 tablet by mouth daily.   estradiol-norethindrone (MIMVEY) 1-0.5 MG tablet Take 1 tablet by mouth daily.   IBU 800 MG tablet Take 1 tablet by mouth 2 (two) times daily as needed.   montelukast (SINGULAIR) 10 MG tablet TAKE 1 TABLET BY MOUTH EVERYDAY AT BEDTIME   ondansetron (ZOFRAN) 4 MG tablet Take 1 tablet (4 mg total) by mouth every 8 (eight) hours as needed for nausea or vomiting.   rizatriptan (MAXALT-MLT) 10 MG disintegrating tablet TAKE 1 TABLET BY MOUTH AS NEEDED FOR MIGRAINE.   [DISCONTINUED] albuterol (VENTOLIN HFA) 108 (90 Base) MCG/ACT inhaler TAKE 2 PUFFS BY MOUTH EVERY 6 HOURS AS NEEDED FOR WHEEZE OR SHORTNESS OF BREATH   Immunization History  Administered Date(s) Administered   Influenza,inj,Quad PF,6+ Mos 05/02/2017   Influenza-Unspecified 04/29/2019, 05/19/2021   Janssen (J&J) SARS-COV-2 Vaccination 01/08/2020        Objective:   Physical Exam BP 132/80 (BP Location:  Left Arm, Cuff Size: Normal)   Pulse 79   Temp 98.3 F (36.8 C) (Temporal)   Ht 5\' 4"  (1.626 m)   Wt 195 lb 12.8 oz (88.8 kg)   SpO2 98%   BMI 33.61 kg/m  GENERAL: Well-developed, overweight woman, no acute distress fully ambulatory, no conversational dyspnea HEAD: Normocephalic, atraumatic.  EYES: Pupils equal, round, reactive to light.  No scleral icterus.  MOUTH: Nose/mouth/throat not examined due to masking requirements for COVID 19. NECK: Supple. No thyromegaly. Trachea midline. No JVD.  No adenopathy. PULMONARY: Good air entry bilaterally.  No adventitious sounds. CARDIOVASCULAR: S1 and S2. Regular rate and rhythm.  No rubs,  murmurs or gallops heard. ABDOMEN: Benign. MUSCULOSKELETAL: No joint deformity, no clubbing, no edema.  NEUROLOGIC: No focal deficits, no gait disturbance, speech is fluent. SKIN: Intact,warm,dry.  On limited exam, no rashes. PSYCH: Mood and behavior are normal.    Assessment & Plan:     ICD-10-CM   1. Mild intermittent asthma without complication  J45.20 Pulmonary Function Test [facility] Only   We will refill as needed albuterol Continue Singulair    2. Personal history of COVID-19  Z86.16    No long-term sequela noted     Orders Placed This Encounter  Procedures   Pulmonary Function Test [facility] Only    Standing Status:   Future    Standing Expiration Date:   04/22/2023    Scheduling Instructions:     33mo    Order Specific Question:   Full PFT: includes the following: basic spirometry, spirometry pre & post bronchodilator, diffusion capacity (DLCO), lung volumes    Answer:   Full PFT   Meds ordered this encounter  Medications   albuterol (VENTOLIN HFA) 108 (90 Base) MCG/ACT inhaler    Sig: Inhale 2 puffs into the lungs every 6 (six) hours as needed for wheezing or shortness of breath.    Dispense:  8 g    Refill:  2   We will reassess lung function.  Continue as needed albuterol.  We will see the patient in follow-up 6 months time she is to contact 7mo prior to that time should any new difficulties arise.   [place], MD [facility]    *This note was dictated using voice recognition software/Dragon.  Despite best efforts to proofread, errors can occur which can change the meaning. Any transcriptional errors that result from this process are unintentional and may not be fully corrected at the time of dictation.

## 2022-04-21 NOTE — Patient Instructions (Addendum)
Your albuterol has been refilled.  We will see you in follow-up in 6 months time call sooner should any new problems arise.

## 2022-05-14 ENCOUNTER — Other Ambulatory Visit: Payer: Self-pay | Admitting: Pulmonary Disease

## 2022-06-29 ENCOUNTER — Other Ambulatory Visit: Payer: Self-pay | Admitting: Neurology

## 2022-07-28 ENCOUNTER — Other Ambulatory Visit: Payer: Self-pay | Admitting: Family Medicine

## 2022-07-28 ENCOUNTER — Other Ambulatory Visit: Payer: Self-pay | Admitting: Pulmonary Disease

## 2022-07-28 DIAGNOSIS — Z1231 Encounter for screening mammogram for malignant neoplasm of breast: Secondary | ICD-10-CM

## 2022-07-29 ENCOUNTER — Ambulatory Visit
Admission: RE | Admit: 2022-07-29 | Discharge: 2022-07-29 | Disposition: A | Discharge status: Home / Self Care | Payer: Federal, State, Local not specified - PPO | Source: Ambulatory Visit | Attending: Family Medicine | Admitting: Family Medicine

## 2022-07-29 DIAGNOSIS — Z1231 Encounter for screening mammogram for malignant neoplasm of breast: Secondary | ICD-10-CM

## 2022-08-04 ENCOUNTER — Other Ambulatory Visit: Payer: Self-pay | Admitting: Neurology

## 2022-08-09 ENCOUNTER — Ambulatory Visit: Admit: 2022-08-09 | Discharge status: Against Medical Advice | Payer: Federal, State, Local not specified - PPO

## 2022-09-14 NOTE — Progress Notes (Unsigned)
NEUROLOGY FOLLOW UP OFFICE NOTE  Norma Dunn [id number]  Assessment/Plan:   Migraine without aura, without status migrainosus, not intractable   Migraine prevention:  Qulipta 60mg  daily *** Migraine rescue:  rizatriptan 10mg ; Zofran for nausea *** Limit use of pain relievers to no more than 2 days out of week to prevent risk of rebound or medication-overuse headache. Keep headache diary Follow up ***     Subjective:  Norma Dunn is a 47 year old woman who follows up for migraines.   UPDATE: Intensity:  moderate Duration:  1 to 2 hours Frequency:  No more than once a month Current NSAIDS:  no Current analgesics:  no Current triptans:  rizatriptan 10mg  Current anti-emetic:  Zofran 4mg  Current muscle relaxants:  no Current anti-anxiolytic:  no Current sleep aide:  no Current Antihypertensive medications:  no Current Antidepressant medications:  no Current Anticonvulsant medications:  no Current CGRP inhibitor:  Qulipta 60mg  QD Current Vitamins/Herbal/Supplements:  no Current Antihistamines/Decongestants:  meclizine Other therapy:  no   Caffeine:  rarely Alcohol:  no Smoker:  no Diet:  Hydrates.  Rarely soda.  Watches diet. Exercise:  yes Depression/anxiety:  No Other pain:  no Sleep hygiene:  Improved.     HISTORY: Onset:  Since her 34s Location:  Usually bi-frontal or either side Quality:  Pressure, sometimes sharp Initial Intensity:  Moderate to severe Aura:  no Prodrome:  forgetful Postdrome:  Sometimes fatigue Associated symptoms:  Nausea, photophobia, phonophobia, blurred vision in left eye, no vomiting.  She has not had any new worse headache of her life, waking up from sleep Initial Duration:  Usually 2 days (but up to a week) Initial Frequency:  Varies.  Some months up to 18 days per month but she may have 1 or 2 months with little or no headache. Initial Frequency of abortive medication: as needed. Triggers:  Menstrual cycle,  stress Relieving factors:  Nothing Activity:  Aggravates.  Cannot function or go to work 5 days a month.   MRI of brain to evaluate headache from 09/21/04 revealed "borderline Roselie Awkward Chiari I malformation" (does not state distance of distension below foramen magnum) and "mild fullness of the pituitary" but otherwise unremarkable.  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    Past NSAIDS:  Ibuprofen 800mg  Past analgesics:  Excedrin, Fioricet Past abortive triptans:  Sumatriptan (ineffective) Past ergot:  Trudhesa NS Past muscle relaxants:  no Past anti-emetic:  Reglan (effective), Promethazine 25mg  (effective but causes drowsiness) Past antihypertensive medications:  no Past antidepressant medications:  nortriptyline (elevated blood pressure) Past anticonvulsant medications:  no Past CGRP inhibitor:  Nurtec (rescue) Past vitamins/Herbal/Supplements:  no Other past therapies:  no   Family history of headache:  Father (migraines)   She started having low back pain radiating down the left leg in December 2020.  She saw orthopedics.  MRI of lumbar spine on 11/20/2019 showed mild broad-based disc bulge with mild bilateral foraminal narrowing and bilateral L5 pars interarticularis defects causing  grade 1 anterolisthesis of L5 on S1. Underwent physical therapy  PAST MEDICAL HISTORY: Past Medical History:  Diagnosis Date   Asthma    Headache     MEDICATIONS: Current Outpatient Medications on File Prior to Visit  Medication Sig Dispense Refill   albuterol (VENTOLIN HFA) 108 (90 Base) MCG/ACT inhaler TAKE 2 PUFFS BY MOUTH EVERY 6  HOURS AS NEEDED FOR WHEEZE OR SHORTNESS OF BREATH 18 each 2   Atogepant (QULIPTA) 60 MG TABS TAKE 1 TABLET BY MOUTH EVERY DAY 30 tablet 1   estradiol-norethindrone (MIMVEY) 1-0.5 MG tablet Take  1 tablet by mouth daily. 84 tablet 3   IBU 800 MG tablet Take 1 tablet by mouth 2 (two) times daily as needed.     montelukast (SINGULAIR) 10 MG tablet TAKE 1 TABLET BY MOUTH EVERYDAY AT BEDTIME 30 tablet 5   ondansetron (ZOFRAN) 4 MG tablet Take 1 tablet (4 mg total) by mouth every 8 (eight) hours as needed for nausea or vomiting. 20 tablet 3   rizatriptan (MAXALT-MLT) 10 MG disintegrating tablet TAKE 1 TABLET BY MOUTH AS NEEDED FOR MIGRAINE. 9 tablet 3   No current facility-administered medications on file prior to visit.    ALLERGIES: Allergies  Allergen Reactions   Latex Itching    FAMILY HISTORY: Family History  Problem Relation Age of Onset   Healthy Mother    Migraines Father    Healthy Brother    Healthy Child    Breast cancer Neg Hx       Objective:  *** General: No acute distress.  Patient appears well-groomed.   Head:  Normocephalic/atraumatic Eyes:  Fundi examined but not visualized Neck: supple, no paraspinal tenderness, full range of motion Heart:  Regular rate and rhythm Neurological Exam: alert and oriented to person, place, and time.  Speech fluent and not dysarthric, language intact.  CN II-XII intact. Bulk and tone normal, muscle strength 5/5 throughout.  Sensation to light touch intact.  Deep tendon reflexes 2+ throughout.  Finger to nose testing intact.  Gait normal, Romberg negative.   Metta Clines, DO

## 2022-09-15 ENCOUNTER — Encounter: Payer: Self-pay | Admitting: Neurology

## 2022-09-15 ENCOUNTER — Ambulatory Visit: Payer: Federal, State, Local not specified - PPO | Admitting: Neurology

## 2022-09-15 VITALS — BP 133/90 | HR 78 | Ht 66.0 in | Wt 191.8 lb

## 2022-09-15 DIAGNOSIS — G43009 Migraine without aura, not intractable, without status migrainosus: Secondary | ICD-10-CM

## 2022-09-15 MED ORDER — QULIPTA 60 MG PO TABS: 1.0000 | ORAL_TABLET | Freq: Every day | ORAL | 11 refills | Status: DC

## 2022-09-15 MED ORDER — RIZATRIPTAN BENZOATE 10 MG PO TBDP: ORAL_TABLET | ORAL | 11 refills | Status: DC

## 2022-09-15 MED ORDER — ONDANSETRON HCL 4 MG PO TABS: 4.0000 mg | ORAL_TABLET | Freq: Three times a day (TID) | ORAL | 11 refills | Status: DC | PRN

## 2022-09-15 NOTE — Patient Instructions (Signed)
1CONTINUE QULIPTA 60MG  DAILY 2USE RIZATRIPTAN AS DIRECTED.  IF MIGRAINE DOES NOT RESPOND TO 2 DOSES OF RIZATRIPTAN, THEN TRY ZAVZPRET NASAL SPRAY (1 SPRAY IN ONE NOSTRIL.  1 IN 24 HOURS).  LET ME KNOW IF YOU WOULD LIKE A PRESCRIPTION 3  ZOFRAN FOR NAUSEA

## 2022-09-15 NOTE — Progress Notes (Signed)
Medication Samples have been provided to the patient.  Drug name: Zavzpret       Strength: 10 mg        Qty: 2  LOT: [id number]  Exp.Date: 2/25  Dosing instructions: as needed  The patient has been instructed regarding the correct time, dose, and frequency of taking this medication, including desired effects and most common side effects.   Venetia Night 8:47 AM 09/15/2022

## 2022-09-27 ENCOUNTER — Other Ambulatory Visit: Payer: Self-pay

## 2022-09-27 ENCOUNTER — Emergency Department
Admission: EM | Admit: 2022-09-27 | Discharge: 2022-09-27 | Disposition: A | Discharge status: Home / Self Care | Payer: Federal, State, Local not specified - PPO | Attending: Emergency Medicine | Admitting: Emergency Medicine

## 2022-09-27 ENCOUNTER — Encounter: Payer: Self-pay | Admitting: Emergency Medicine

## 2022-09-27 ENCOUNTER — Emergency Department: Payer: Federal, State, Local not specified - PPO

## 2022-09-27 DIAGNOSIS — S161XXA Strain of muscle, fascia and tendon at neck level, initial encounter: Secondary | ICD-10-CM | POA: Insufficient documentation

## 2022-09-27 DIAGNOSIS — M542 Cervicalgia: Secondary | ICD-10-CM | POA: Diagnosis present

## 2022-09-27 DIAGNOSIS — S63501A Unspecified sprain of right wrist, initial encounter: Secondary | ICD-10-CM | POA: Diagnosis not present

## 2022-09-27 DIAGNOSIS — J45909 Unspecified asthma, uncomplicated: Secondary | ICD-10-CM | POA: Insufficient documentation

## 2022-09-27 DIAGNOSIS — Y92410 Unspecified street and highway as the place of occurrence of the external cause: Secondary | ICD-10-CM | POA: Diagnosis not present

## 2022-09-27 MED ORDER — BACLOFEN 10 MG PO TABS: 10.0000 mg | ORAL_TABLET | Freq: Three times a day (TID) | ORAL | 0 refills | Status: AC

## 2022-09-27 MED ORDER — MELOXICAM 15 MG PO TABS: 15.0000 mg | ORAL_TABLET | Freq: Every day | ORAL | 2 refills | Status: AC
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
## 2022-10-12 ENCOUNTER — Ambulatory Visit
Admission: RE | Admit: 2022-10-12 | Discharge: 2022-10-12 | Disposition: A | Discharge status: Home / Self Care | Payer: Federal, State, Local not specified - PPO | Source: Ambulatory Visit | Attending: Urgent Care | Admitting: Urgent Care

## 2022-10-12 VITALS — BP 132/89 | HR 87 | Temp 98.9°F | Resp 18

## 2022-10-12 DIAGNOSIS — R6889 Other general symptoms and signs: Secondary | ICD-10-CM

## 2022-10-12 MED ORDER — OSELTAMIVIR PHOSPHATE 75 MG PO CAPS: 75.0000 mg | ORAL_CAPSULE | Freq: Two times a day (BID) | ORAL | 0 refills | Status: DC

## 2022-10-12 NOTE — ED Triage Notes (Signed)
Congestion, sneezing, dizzy, cough, nausea, headache, that started Monday. Brother was positive for the flu Friday and she was around him Friday. Taking mucinex, alka selter.

## 2022-10-12 NOTE — Discharge Instructions (Addendum)
You have been diagnosed with a viral upper respiratory infection based on your symptoms and exam. Viral illnesses cannot be treated with antibiotics - they are self limiting - and you should find your symptoms resolving within a few days. Get plenty of rest and non-caffeinated fluids. Watch for signs of dehydration including reduced urine output and dark colored urine.  We have performed a respiratory swab testing for COVID. I have prescribed Tamiflu, antiviral therapy for influenza A, based on a presumptive diagnosis of influenza.  Someone will contact you after results of your swab are available with instructions to continue or stop this medication.If the results of this testing are positive, someone will call you if you are eligible for any antiviral treatment.    We recommend you use over-the-counter medications for symptom control including acetaminophen (Tylenol), ibuprofen (Advil/Motrin) or naproxen (Aleve) for throat pain, fever, chills or body aches. You may combine use of acetaminophen and ibuprofen/naproxen if needed.  Some patients find an pain-relieving throat spray such as Chloraseptic to be effective.  Also recommend cold/cough medication containing a cough suppressant such as dextromethorphan, as needed. Please note that some cough medications are not recommended if you suffer from hypertension.    Saline mist spray is helpful for removing excess mucus from your nose.  Room humidifiers are helpful to ease breathing at night. I recommend guaifenesin (Mucinex) with plenty of water throughout the day to help thin and loosen mucus secretions in your respiratory passages.   If appropriate based upon your other medical problems, you might also find relief of nasal/sinus congestion symptoms by using a nasal decongestant such as fluticasone (Flonase ) or pseudoephedrine (Sudafed sinus).  You will need to obtain Sudafed from behind the pharmacist counter.  Speak to the pharmacist to verify that you  are not duplicating medications with other over-the-counter formulations that you may be using.

## 2022-10-12 NOTE — ED Provider Notes (Signed)
Roderic Palau    CSN: [id number] Arrival date & time: 10/12/22  [id number]      History   Chief Complaint Chief Complaint  Patient presents with   Nasal Congestion    Cough Fatigue - Entered by patient   Headache    HPI Norma Dunn is a 47 y.o. female.    Headache   Patient presents to urgent care with complaint for congestion, sneezing, dizzy, cough, nausea, headache with symptoms starting 2 days ago.  She states her brother was positive for flu when she was around him last week.  Past Medical History:  Diagnosis Date   Asthma    Headache     Patient Active Problem List   Diagnosis Date Noted   Pelvic pain in female 04/21/2022   Mild intermittent asthma without complication [id number]   Personal history of COVID-19 04/21/2022    Past Surgical History:  Procedure Laterality Date   OOPHORECTOMY  2005   right   TUBAL LIGATION     left   TUBAL LIGATION     left    OB History     Gravida  4   Para  3   Term  3   Preterm      AB  1   Living  3      SAB      IAB  1   Ectopic      Multiple      Live Births  3            Home Medications    Prior to Admission medications   Medication Sig Start Date End Date Taking? Authorizing Provider  albuterol (VENTOLIN HFA) 108 (90 Base) MCG/ACT inhaler TAKE 2 PUFFS BY MOUTH EVERY 6 HOURS AS NEEDED FOR WHEEZE OR SHORTNESS OF BREATH 07/28/22  Yes Tyler Pita, MD  Atogepant (QULIPTA) 60 MG TABS Take 1 tablet (60 mg total) by mouth daily. 09/15/22  Yes Jaffe, Adam R, DO  estradiol-norethindrone (MIMVEY) 1-0.5 MG tablet Take 1 tablet by mouth daily. 01/26/22  Yes Harlin Heys, MD  IBU 800 MG tablet Take 1 tablet by mouth 2 (two) times daily as needed. 06/01/17  Yes [provider]  meloxicam (MOBIC) 15 MG tablet Take 1 tablet (15 mg total) by mouth daily. 09/27/22 09/27/23 Yes Fisher, Linden Dolin, PA-C  montelukast (SINGULAIR) 10 MG tablet TAKE 1 TABLET BY MOUTH EVERYDAY AT BEDTIME  05/16/22  Yes Tyler Pita, MD  ondansetron (ZOFRAN) 4 MG tablet Take 1 tablet (4 mg total) by mouth every 8 (eight) hours as needed for nausea or vomiting. 09/15/22  Yes Jaffe, Adam R, DO  rizatriptan (MAXALT-MLT) 10 MG disintegrating tablet TAKE 1 TABLET BY MOUTH AS NEEDED FOR MIGRAINE.  MAY REPEAT AFTER 2 HOURS.  MAXIMUM 2 TABLETS IN 24 HOURS. 09/15/22  Yes Pieter Partridge, DO    Family History Family History  Problem Relation Age of Onset   Healthy Mother    Migraines Father    Healthy Brother    Healthy Child    Breast cancer Neg Hx     Social History Social History   Tobacco Use   Smoking status: Former    Types: Cigars    Quit date: 08/22/1997    Years since quitting: 25.1   Smokeless tobacco: Never   Tobacco comments:    not a daily smoker.  Vaping Use   Vaping Use: Never used  Substance Use Topics   Alcohol use:  No   Drug use: Never     Allergies   Latex   Review of Systems Review of Systems  Neurological:  Positive for headaches.     Physical Exam Triage Vital Signs ED Triage Vitals [10/12/22 1928]  Enc Vitals Group     BP 132/89     Pulse Rate 87     Resp 18     Temp 98.9 F (37.2 C)     Temp Source Oral     SpO2 98 %     Weight      Height      Head Circumference      Peak Flow      Pain Score 6     Pain Loc      Pain Edu?      Excl. in [place]?    No data found.  Updated Vital Signs BP 132/89 (BP Location: Right Arm)   Pulse 87   Temp 98.9 F (37.2 C) (Oral)   Resp 18   SpO2 98%   Visual Acuity Right Eye Distance:   Left Eye Distance:   Bilateral Distance:    Right Eye Near:   Left Eye Near:    Bilateral Near:     Physical Exam Vitals reviewed.  Constitutional:      Appearance: She is well-developed.  Cardiovascular:     Rate and Rhythm: Normal rate and regular rhythm.     Heart sounds: Normal heart sounds.  Pulmonary:     Effort: Pulmonary effort is normal.     Breath sounds: Normal breath sounds.  Skin:    General:  Skin is warm and dry.  Neurological:     Mental Status: She is alert and oriented to person, place, and time.  Psychiatric:        Mood and Affect: Mood normal.        Behavior: Behavior normal.      UC Treatments / Results  Labs (all labs ordered are listed, but only abnormal results are displayed) Labs Reviewed - No data to display  EKG   Radiology No results found.  Procedures Procedures (including critical care time)  Medications Ordered in UC Medications - No data to display  Initial Impression / Assessment and Plan / UC Course  I have reviewed the triage vital signs and the nursing notes.  Pertinent labs & imaging results that were available during my care of the patient were reviewed by me and considered in my medical decision making (see chart for details).   Patient is afebrile here without recent antipyretics. Satting well on room air. Overall is well appearing, well hydrated, without respiratory distress. Pulmonary exam is unremarkable.  Lungs CTAB without wheezing, rhonchi, rales.  No pharyngeal erythema or peritonsillar exudates.  Patient's symptoms are consistent with an acute viral process, possibly flu though there is no fever.  Given her positive exposure with influenza and she remains within treatment window, will prescribe antiviral therapy presumptively.  Otherwise, recommending use of OTC medication for symptom control.  Final Clinical Impressions(s) / UC Diagnoses   Final diagnoses:  None   Discharge Instructions   None    ED Prescriptions   None    PDMP not reviewed this encounter.   Rose Phi, [place] 10/12/22 [phone]

## 2022-10-26 ENCOUNTER — Telehealth: Payer: Self-pay | Admitting: Pulmonary Disease

## 2022-10-26 NOTE — Telephone Encounter (Signed)
Anita, please advise. Thanks 

## 2022-10-26 NOTE — Telephone Encounter (Signed)
I have spoke with Norma Dunn and her PFT has been scheduled on 11/17/22 @ 1:00pm  [facility] Mailed instructions to her

## 2022-10-26 NOTE — Telephone Encounter (Signed)
Pt calling to get schedled for Pft at [facility]

## 2022-11-17 ENCOUNTER — Ambulatory Visit: Payer: Federal, State, Local not specified - PPO | Attending: Pulmonary Disease

## 2022-11-17 DIAGNOSIS — J4520 Mild intermittent asthma, uncomplicated: Secondary | ICD-10-CM | POA: Diagnosis not present

## 2022-11-17 LAB — PULMONARY FUNCTION TEST ARMC ONLY
DL/VA % pred: 100 %
DL/VA: 4.36 ml/min/mmHg/L
DLCO unc % pred: 88 %
DLCO unc: 19.53 ml/min/mmHg
FEF 25-75 Post: 2.69 L/sec
FEF 25-75 Pre: 2.43 L/sec
FEF2575-%Change-Post: 10 %
FEF2575-%Pred-Post: 90 %
FEF2575-%Pred-Pre: 81 %
FEV1-%Change-Post: 1 %
FEV1-%Pred-Post: 83 %
FEV1-%Pred-Pre: 81 %
FEV1-Post: 2.48 L
FEV1-Pre: 2.44 L
FEV1FVC-%Change-Post: 8 %
FEV1FVC-%Pred-Pre: 98 %
FEV6-%Change-Post: -6 %
FEV6-%Pred-Post: 78 %
FEV6-%Pred-Pre: 83 %
FEV6-Post: 2.85 L
FEV6-Pre: 3.06 L
FEV6FVC-%Pred-Post: 102 %
FEV6FVC-%Pred-Pre: 102 %
FVC-%Change-Post: -5 %
FVC-%Pred-Post: 76 %
FVC-%Pred-Pre: 81 %
FVC-Post: 2.87 L
FVC-Pre: 3.06 L
Post FEV1/FVC ratio: 87 %
Post FEV6/FVC ratio: 100 %
Pre FEV1/FVC ratio: 80 %
Pre FEV6/FVC Ratio: 100 %
RV % pred: 76 %
RV: 1.34 L
TLC % pred: 86 %
TLC: 4.48 L

## 2022-11-20 ENCOUNTER — Other Ambulatory Visit: Payer: Self-pay | Admitting: Pulmonary Disease

## 2022-12-07 ENCOUNTER — Telehealth: Payer: Self-pay

## 2022-12-07 NOTE — Telephone Encounter (Signed)
Patient to pick up samples of Qulipta, Medication Samples have been provided to the patient.  Drug name: Bennie Pierini       Strength: 60 mg        Qty: 7  LOT: [id number]  Exp.Date: 2/25  Dosing instructions: daily  The patient has been instructed regarding the correct time, dose, and frequency of taking this medication, including desired effects and most common side effects.   Norma Dunn 9:13 AM 12/07/2022

## 2022-12-27 ENCOUNTER — Other Ambulatory Visit (HOSPITAL_COMMUNITY): Payer: Self-pay

## 2023-01-21 ENCOUNTER — Telehealth: Payer: Self-pay | Admitting: Pharmacy Technician

## 2023-01-21 NOTE — Telephone Encounter (Signed)
Patient Advocate Encounter  Received notification from [facility] that prior authorization for QULIPTA 60MG  is required.   PA submitted on 6.1.24 Key [id number] Status is pending

## 2023-01-25 NOTE — Telephone Encounter (Signed)
Patient Advocate Encounter  Prior Authorization for Qulipta 60MG  tablets has been approved through [employer].    KeyEstelle June  Effective: 12-21-2022 to 01-20-2024

## 2023-01-26 NOTE — Telephone Encounter (Signed)
Patient advised.

## 2023-02-02 ENCOUNTER — Encounter: Payer: Self-pay | Admitting: Obstetrics and Gynecology

## 2023-02-02 ENCOUNTER — Other Ambulatory Visit (HOSPITAL_COMMUNITY)
Admission: RE | Admit: 2023-02-02 | Discharge: 2023-02-02 | Disposition: A | Discharge status: Home / Self Care | Payer: Federal, State, Local not specified - PPO | Source: Ambulatory Visit | Attending: Obstetrics and Gynecology | Admitting: Obstetrics and Gynecology

## 2023-02-02 ENCOUNTER — Ambulatory Visit (INDEPENDENT_AMBULATORY_CARE_PROVIDER_SITE_OTHER): Payer: Federal, State, Local not specified - PPO | Admitting: Obstetrics and Gynecology

## 2023-02-02 VITALS — BP 122/84 | HR 75 | Ht 65.0 in | Wt 188.7 lb

## 2023-02-02 DIAGNOSIS — Z01419 Encounter for gynecological examination (general) (routine) without abnormal findings: Secondary | ICD-10-CM | POA: Insufficient documentation

## 2023-02-02 DIAGNOSIS — Z124 Encounter for screening for malignant neoplasm of cervix: Secondary | ICD-10-CM

## 2023-02-02 DIAGNOSIS — N951 Menopausal and female climacteric states: Secondary | ICD-10-CM

## 2023-02-02 MED ORDER — ESTRADIOL-NORETHINDRONE ACET 1-0.5 MG PO TABS: 1.0000 | ORAL_TABLET | Freq: Every day | ORAL | 3 refills | Status: DC

## 2023-02-02 NOTE — Progress Notes (Signed)
HPI:      [name] is a 47 y.o. [phone] who LMP was No LMP recorded. (Menstrual status: Perimenopausal).  Subjective:   She presents today for her annual examination.  She has no complaints.  She likes being on her Activella and would like to continue.  She reports no vaginal bleeding.  She is scheduled for blood work this morning. Her Pap last year was ASCUS but negative HPV.    Hx: The following portions of the patient's history were reviewed and updated as appropriate:             She  has a past medical history of Asthma and Headache. She does not have any pertinent problems on file. She  has a past surgical history that includes Oophorectomy (2005); Tubal ligation; and Tubal ligation. Her family history includes Healthy in her brother, child, and mother; Migraines in her father. She  reports that she quit smoking about 25 years ago. Her smoking use included cigars. She has never used smokeless tobacco. She reports that she does not drink alcohol and does not use drugs. She has a current medication list which includes the following prescription(s): albuterol, qulipta, ibu, meloxicam, montelukast, ondansetron, rizatriptan, and estradiol-norethindrone. She is allergic to latex.       Review of Systems:  Review of Systems  Constitutional: Denied constitutional symptoms, night sweats, recent illness, fatigue, fever, insomnia and weight loss.  Eyes: Denied eye symptoms, eye pain, photophobia, vision change and visual disturbance.  Ears/Nose/Throat/Neck: Denied ear, nose, throat or neck symptoms, hearing loss, nasal discharge, sinus congestion and sore throat.  Cardiovascular: Denied cardiovascular symptoms, arrhythmia, chest pain/pressure, edema, exercise intolerance, orthopnea and palpitations.  Respiratory: Denied pulmonary symptoms, asthma, pleuritic pain, productive sputum, cough, dyspnea and wheezing.  Gastrointestinal: Denied, gastro-esophageal reflux, melena, nausea and  vomiting.  Genitourinary: Denied genitourinary symptoms including symptomatic vaginal discharge, pelvic relaxation issues, and urinary complaints.  Musculoskeletal: Denied musculoskeletal symptoms, stiffness, swelling, muscle weakness and myalgia.  Dermatologic: Denied dermatology symptoms, rash and scar.  Neurologic: Denied neurology symptoms, dizziness, headache, neck pain and syncope.  Psychiatric: Denied psychiatric symptoms, anxiety and depression.  Endocrine: Denied endocrine symptoms including hot flashes and night sweats.   Meds:   Current Outpatient Medications on File Prior to Visit  Medication Sig Dispense Refill   albuterol (VENTOLIN HFA) 108 (90 Base) MCG/ACT inhaler TAKE 2 PUFFS BY MOUTH EVERY 6 HOURS AS NEEDED FOR WHEEZE OR SHORTNESS OF BREATH 18 each 2   Atogepant (QULIPTA) 60 MG TABS Take 1 tablet (60 mg total) by mouth daily. 30 tablet 11   IBU 800 MG tablet Take 1 tablet by mouth 2 (two) times daily as needed.     meloxicam (MOBIC) 15 MG tablet Take 1 tablet (15 mg total) by mouth daily. 30 tablet 2   montelukast (SINGULAIR) 10 MG tablet TAKE 1 TABLET BY MOUTH EVERYDAY AT BEDTIME 90 tablet 1   ondansetron (ZOFRAN) 4 MG tablet Take 1 tablet (4 mg total) by mouth every 8 (eight) hours as needed for nausea or vomiting. 20 tablet 11   rizatriptan (MAXALT-MLT) 10 MG disintegrating tablet TAKE 1 TABLET BY MOUTH AS NEEDED FOR MIGRAINE.  MAY REPEAT AFTER 2 HOURS.  MAXIMUM 2 TABLETS IN 24 HOURS. 9 tablet 11   No current facility-administered medications on file prior to visit.     Objective:     Vitals:   02/02/23 0814  BP: 122/84  Pulse: 75    Filed Weights   02/02/23 0814  Weight: 188 lb 11.2 oz (85.6 kg)              Physical examination General NAD, Conversant  HEENT Atraumatic; Op clear with mmm.  Normo-cephalic. Pupils reactive. Anicteric sclerae  Thyroid/Neck Smooth without nodularity or enlargement. Normal ROM.  Neck Supple.  Skin No rashes, lesions or  ulceration. Normal palpated skin turgor. No nodularity.  Breasts: No masses or discharge.  Symmetric.  No axillary adenopathy.  Lungs: Clear to auscultation.No rales or wheezes. Normal Respiratory effort, no retractions.  Heart: NSR.  No murmurs or rubs appreciated. No peripheral edema  Abdomen: Soft.  Non-tender.  No masses.  No HSM. No hernia  Extremities: Moves all appropriately.  Normal ROM for age. No lymphadenopathy.  Neuro: Oriented to PPT.  Normal mood. Normal affect.     Pelvic:   Vulva: Normal appearance.  No lesions.  Vagina: No lesions or abnormalities noted.  Support: Normal pelvic support.  Urethra No masses tenderness or scarring.  Meatus Normal size without lesions or prolapse.  Cervix: Normal appearance.  No lesions.  Anus: Normal exam.  No lesions.  Perineum: Normal exam.  No lesions.        Bimanual   Uterus: Normal size.  Non-tender.  Mobile.  AV.  Adnexae: No masses.  Non-tender to palpation.  Cul-de-sac: Negative for abnormality.     Assessment:    [id number] Patient Active Problem List   Diagnosis Date Noted   Pelvic pain in female 04/21/2022   Mild intermittent asthma without complication 04/21/2022   Personal history of COVID-19 04/21/2022     1. Well woman exam with routine gynecological exam   2. Cervical cancer screening   3. Symptomatic menopausal or female climacteric states     Normal exam   Plan:            1.  Basic Screening Recommendations The basic screening recommendations for asymptomatic women were discussed with the patient during her visit.  The age-appropriate recommendations were discussed with her and the rational for the tests reviewed.  When I am informed by the patient that another primary care physician has previously obtained the age-appropriate tests and they are up-to-date, only outstanding tests are ordered and referrals given as necessary.  Abnormal results of tests will be discussed with her when all of her results are  completed.  Routine preventative health maintenance measures emphasized: Exercise/Diet/Weight control, Tobacco Warnings, Alcohol/Substance use risks and Stress Management Pap performed-mammogram ordered-blood work today. 2.  Continue Activella. Orders Orders Placed This Encounter  Procedures   Basic metabolic panel   CBC   TSH   Lipid panel   Hemoglobin A1c     Meds ordered this encounter  Medications   estradiol-norethindrone (MIMVEY) 1-0.5 MG tablet    Sig: Take 1 tablet by mouth daily.    Dispense:  84 tablet    Refill:  3          F/U  Return in about 1 year (around 02/02/2024) for Annual Physical, We will contact her with any abnormal test results.  Elonda Husky, M.D. 02/02/2023 8:27 AM

## 2023-02-02 NOTE — Progress Notes (Signed)
Patients presents for annual exam today. She states liking her current HRT, would like to continue. Patient is due for pap smear, ordered. Mammogram up to date. Annual labs are ordered. She states no other questions or concerns at this time.

## 2023-02-03 LAB — CBC
Hematocrit: 37.4 % (ref 34.0–46.6)
Hemoglobin: 12.8 g/dL (ref 11.1–15.9)
MCH: 29.5 pg (ref 26.6–33.0)
MCHC: 34.2 g/dL (ref 31.5–35.7)
MCV: 86 fL (ref 79–97)
Platelets: 256 10*3/uL (ref 150–450)
RBC: 4.34 x10E6/uL (ref 3.77–5.28)
RDW: 12.2 % (ref 11.7–15.4)
WBC: 6.5 10*3/uL (ref 3.4–10.8)

## 2023-02-03 LAB — LIPID PANEL
Chol/HDL Ratio: 2.6 ratio (ref 0.0–4.4)
Cholesterol, Total: 148 mg/dL (ref 100–199)
HDL: 58 mg/dL (ref >39)
LDL Chol Calc (NIH): 81 mg/dL (ref 0–99)
Triglycerides: 39 mg/dL (ref 0–149)
VLDL Cholesterol Cal: 9 mg/dL (ref 5–40)

## 2023-02-03 LAB — TSH: TSH: 0.94 u[IU]/mL (ref 0.450–4.500)

## 2023-02-03 LAB — BASIC METABOLIC PANEL
BUN/Creatinine Ratio: 10 (ref 9–23)
BUN: 10 mg/dL (ref 6–24)
CO2: 22 mmol/L (ref 20–29)
Calcium: 9 mg/dL (ref 8.7–10.2)
Chloride: 106 mmol/L (ref 96–106)
Creatinine, Ser: 0.96 mg/dL (ref 0.57–1.00)
Glucose: 82 mg/dL (ref 70–99)
Potassium: 4.1 mmol/L (ref 3.5–5.2)
Sodium: 142 mmol/L (ref 134–144)
eGFR: 74 mL/min/{1.73_m2} (ref >59)

## 2023-02-03 LAB — HEMOGLOBIN A1C
Est. average glucose Bld gHb Est-mCnc: 126 mg/dL
Hgb A1c MFr Bld: 6 % — ABNORMAL HIGH (ref 4.8–5.6)

## 2023-02-08 LAB — CYTOLOGY - PAP
Comment: NEGATIVE
Diagnosis: UNDETERMINED — AB
High risk HPV: NEGATIVE

## 2023-02-13 ENCOUNTER — Telehealth: Payer: Self-pay | Admitting: Neurology

## 2023-02-13 ENCOUNTER — Ambulatory Visit (INDEPENDENT_AMBULATORY_CARE_PROVIDER_SITE_OTHER): Payer: Federal, State, Local not specified - PPO

## 2023-02-13 DIAGNOSIS — G43009 Migraine without aura, not intractable, without status migrainosus: Secondary | ICD-10-CM | POA: Diagnosis not present

## 2023-02-13 MED ORDER — KETOROLAC TROMETHAMINE 60 MG/2ML IM SOLN
60.0000 mg | Freq: Once | INTRAMUSCULAR | Status: AC
  Administered 2023-02-13: 60 mg via INTRAMUSCULAR

## 2023-02-13 MED ORDER — DIPHENHYDRAMINE HCL 50 MG/ML IJ SOLN
50.0000 mg | Freq: Once | INTRAMUSCULAR | Status: AC
  Administered 2023-02-13: 25 mg via INTRAMUSCULAR

## 2023-02-13 MED ORDER — METOCLOPRAMIDE HCL 5 MG/ML IJ SOLN
10.0000 mg | Freq: Once | INTRAMUSCULAR | Status: AC
  Administered 2023-02-13: 10 mg via INTRAMUSCULAR

## 2023-02-13 NOTE — Telephone Encounter (Signed)
Per Dr.Jaffe, She may come in for full headache cocktail if she has a driver.  Otherwise, we can give her Toradol 60mg  alone.

## 2023-02-13 NOTE — Telephone Encounter (Signed)
How frequent or the headaches (on average, how many days a week/month are they occurring)?   How long do the headaches last?  1 week.  Verify what preventative medication and dose you are taking (e.g. topiramate, propranolol, amitriptyline, Emgality, etc)  Qulipta 60 mg Verify which rescue medication you are taking (triptan, Advil, Excedrin, Aleve, Ubrelvy, etc)  Rizatriptan 10 mg How often are you taking pain relievers/analgesics/rescue mediction?  Ibuprofen  200 mg , Saturday she had 1000 mg.

## 2023-02-13 NOTE — Telephone Encounter (Signed)
Pt has had a migraine for over a week and would like to see if she can come in for a cocktail.  Pt would like to have a call back.

## 2023-02-24 ENCOUNTER — Emergency Department: Payer: Federal, State, Local not specified - PPO

## 2023-02-24 ENCOUNTER — Other Ambulatory Visit: Payer: Self-pay

## 2023-02-24 ENCOUNTER — Encounter: Payer: Self-pay | Admitting: Emergency Medicine

## 2023-02-24 ENCOUNTER — Emergency Department
Admission: EM | Admit: 2023-02-24 | Discharge: 2023-02-24 | Disposition: A | Discharge status: Home / Self Care | Payer: Federal, State, Local not specified - PPO | Attending: Emergency Medicine | Admitting: Emergency Medicine

## 2023-02-24 DIAGNOSIS — G43109 Migraine with aura, not intractable, without status migrainosus: Secondary | ICD-10-CM | POA: Diagnosis not present

## 2023-02-24 DIAGNOSIS — R519 Headache, unspecified: Secondary | ICD-10-CM | POA: Diagnosis present

## 2023-02-24 MED ORDER — SODIUM CHLORIDE 0.9 % IV BOLUS
250.0000 mL | Freq: Once | INTRAVENOUS | Status: AC
  Administered 2023-02-24: 250 mL via INTRAVENOUS

## 2023-02-24 MED ORDER — PREDNISONE 20 MG PO TABS: 20.0000 mg | ORAL_TABLET | Freq: Every day | ORAL | 0 refills | Status: DC

## 2023-02-24 MED ORDER — METOCLOPRAMIDE HCL 5 MG/ML IJ SOLN
10.0000 mg | Freq: Once | INTRAMUSCULAR | Status: AC
  Administered 2023-02-24: 10 mg via INTRAVENOUS
  Filled 2023-02-24: qty 2

## 2023-02-24 MED ORDER — DEXAMETHASONE SODIUM PHOSPHATE 10 MG/ML IJ SOLN
10.0000 mg | Freq: Once | INTRAMUSCULAR | Status: AC
  Administered 2023-02-24: 10 mg via INTRAVENOUS
  Filled 2023-02-24: qty 1

## 2023-02-24 MED ORDER — DIPHENHYDRAMINE HCL 50 MG/ML IJ SOLN
25.0000 mg | Freq: Once | INTRAMUSCULAR | Status: AC
  Administered 2023-02-24: 25 mg via INTRAVENOUS
  Filled 2023-02-24: qty 1

## 2023-02-24 MED ORDER — KETOROLAC TROMETHAMINE 15 MG/ML IJ SOLN
15.0000 mg | Freq: Once | INTRAMUSCULAR | Status: AC
  Administered 2023-02-24: 15 mg via INTRAVENOUS
  Filled 2023-02-24: qty 1

## 2023-02-24 NOTE — ED Provider Notes (Signed)
[facility] Emergency Department Provider Note     Event Date/Time   First MD Initiated Contact with Patient 02/24/23 1214     (approximate)   History   Migraine   HPI  Norma Dunn is a 47 y.o. female who presents to the ED for a migraine headache.  Patient has a history of migraines in which she takes Rizatriptan. Patient reports unilateral localized pain to the left side. She reports this migraine being similar to her normal migraines but has increased with intensity over the week. Endorses nausea and flashing lights. She reports not getting any relief from her medication.  Denies fever, trauma, and weakness.  Noncontributory medical history.  Physical Exam   Triage Vital Signs: ED Triage Vitals  Enc Vitals Group     BP 02/24/23 1129 (!) 139/103     Pulse Rate 02/24/23 1129 71     Resp 02/24/23 1129 18     Temp 02/24/23 1129 98.6 F (37 C)     Temp Source 02/24/23 1129 Oral     SpO2 02/24/23 1129 98 %     Weight 02/24/23 1132 190 lb (86.2 kg)     Height 02/24/23 1132 5\' 5"  (1.651 m)     Head Circumference --      Peak Flow --      Pain Score 02/24/23 1132 8     Pain Loc --      Pain Edu? --      Excl. in GC? --     Most recent vital signs: Vitals:   02/24/23 1129 02/24/23 1425  BP: (!) 139/103 (!) 143/99  Pulse: 71 80  Resp: 18 16  Temp: 98.6 F (37 C)   SpO2: 98% 99%   General: Alert and oriented. INAD.   Head:  NCAT.  scalp is nontender. Eyes:  PERRLA. EOMI. Conjunctivae clear. Neck:   No cervical spine tenderness to palpation.  CV:  Good peripheral perfusion. RRR.  RESP:  Normal effort. LCTAB. MSK:   Full ROM in all joints. No swelling, deformity or tenderness.  NEURO: Cranial nerves intact. No focal deficits. Sensation and motor function intact. Grip strength equal. Coordination test smooth with heel-shin test. Gait is steady.    ED Results / Procedures / Treatments   Labs (all labs ordered are listed, but only  abnormal results are displayed) Labs Reviewed - No data to display  RADIOLOGY  I personally viewed and evaluated these images as part of my medical decision making, as well as reviewing the written report by the radiologist.  ED Provider Interpretation: CT head is unremarkable.  CT Head Wo Contrast  Result Date: 02/24/2023 CLINICAL DATA:  Migraines EXAM: CT HEAD WITHOUT CONTRAST TECHNIQUE: Contiguous axial images were obtained from the base of the skull through the vertex without intravenous contrast. RADIATION DOSE REDUCTION: This exam was performed according to the departmental dose-optimization program which includes automated exposure control, adjustment of the mA and/or kV according to patient size and/or use of iterative reconstruction technique. COMPARISON:  10/26/2020 FINDINGS: Brain: No evidence of acute infarction, hemorrhage, hydrocephalus, extra-axial collection or mass lesion/mass effect. Vascular: No hyperdense vessel or unexpected calcification. Skull: Normal. Negative for fracture or focal lesion. Sinuses/Orbits: No acute finding. Other: None. IMPRESSION: No acute intracranial pathology. Electronically Signed   By: Jearld Lesch M.D.   On: 02/24/2023 13:17    PROCEDURES:  Critical Care performed: No  Procedures  MEDICATIONS ORDERED IN ED: Medications  ketorolac (TORADOL) 15 MG/ML  injection 15 mg (15 mg Intravenous Given 02/24/23 1336)  metoCLOPramide (REGLAN) injection 10 mg (10 mg Intravenous Given 02/24/23 1337)  diphenhydrAMINE (BENADRYL) injection 25 mg (25 mg Intravenous Given 02/24/23 1332)  dexamethasone (DECADRON) injection 10 mg (10 mg Intravenous Given 02/24/23 1333)  sodium chloride 0.9 % bolus 250 mL (0 mLs Intravenous Stopped 02/24/23 1400)    IMPRESSION / MDM / ASSESSMENT AND PLAN / ED COURSE  I reviewed the triage vital signs and the nursing notes.                              Clinical Course as of 02/24/23 1652  Fri Feb 24, 2023  1416 Complete resolution [MH]     Clinical Course User Index [MH] Conrad [place], PA-C   47 y.o. female presents to the emergency department for evaluation and treatment of chronic migraine. See HPI for further details.   Differential diagnosis includes, but is not limited to, intracranial hemorrhage, meningitis/encephalitis, previous head trauma, cavernous venous thrombosis, tension headache, temporal arteritis, migraine or migraine equivalent, idiopathic intracranial hypertension, and non-specific headache.  Chart reviewed.  CT head is reassuring.  Patient administered migraine cocktail -Reglan, Toradol, Decadron, and Benadryl. Complete resolution of symptoms.  Patient is in stable condition for discharge home.  She will be discharged home with prescriptions for prednisone short course till she is able to follow-up with her neurologist in 1 week. Patient is given ED precautions to return to the ED for any worsening or new symptoms. All questions and concerns were addressed during ED visit.    Patient's presentation is most consistent with acute complicated illness / injury requiring diagnostic workup.  FINAL CLINICAL IMPRESSION(S) / ED DIAGNOSES   Final diagnoses:  Migraine with aura and without status migrainosus, not intractable   Rx / DC Orders   ED Discharge Orders          Ordered    predniSONE (DELTASONE) 20 MG tablet  Daily        02/24/23 1416           Note:  This document was prepared using Dragon voice recognition software and may include unintentional dictation errors.   Romeo Apple, Ambrosia Wisnewski A, PA-C 02/24/23 1653    Jene Every, MD 02/27/23 1242

## 2023-02-24 NOTE — ED Notes (Signed)
Pt offered wheelchair but pt declined offer.

## 2023-02-24 NOTE — ED Notes (Signed)
See triage note. Pt reports L sided anterior head pain; denies slurred speech; reports intermittent weakness, blurred vision, feeling off balance/lightheaded and nauseous. Pt's speech currently clear. Pt in NAD; skin dry, resp reg/unlabored, sitting calmly on stretcher. Visitors remain with pt.

## 2023-02-24 NOTE — ED Triage Notes (Signed)
Pt sts that she takes a preventative medication and a medication for the onset of a migraine. Pt sts that she has been having migraine s/s since the middle of June. Pt sts that she went to her PCP last week and was able to get a migraine cocktail but that did not help her.

## 2023-02-24 NOTE — ED Notes (Signed)
To bedside with meds. Pt away at imaging. Will give once pt back to room. Visitors at bedside. Hx of migraines but visitors state it has been a very long time since pt had one and never this bad. State they were concerned for a stroke and so pt came to ER. Per visitors pt was moving more slowly than normal they think due to pain. Denies changes to pt's speech. Will eval pt once she is back to room.

## 2023-02-27 ENCOUNTER — Encounter: Payer: Self-pay | Admitting: Neurology

## 2023-02-27 DIAGNOSIS — Z0279 Encounter for issue of other medical certificate: Secondary | ICD-10-CM

## 2023-03-03 ENCOUNTER — Telehealth: Payer: Self-pay

## 2023-03-03 NOTE — Telephone Encounter (Signed)
FMLA Paperwork signed and ready for pick up

## 2023-03-13 ENCOUNTER — Telehealth: Payer: Self-pay | Admitting: Neurology

## 2023-03-13 ENCOUNTER — Encounter: Payer: Self-pay | Admitting: Neurology

## 2023-03-13 MED ORDER — PREDNISONE 10 MG (21) PO TBPK: ORAL_TABLET | ORAL | 0 refills | Status: DC

## 2023-03-13 NOTE — Telephone Encounter (Signed)
Patient is experiencing a migraine for the last month, Patient states that she feels like she is having vertigo symptoms. Patient would like know if Dr. Everlena Cooper would like to see her or just call in medication for her. Evlyn Clines

## 2023-03-13 NOTE — Telephone Encounter (Signed)
Per Dr.Jaffe,  We can prescribe her a prednisone taper and she should follow up.  Follow up scheduled

## 2023-03-16 NOTE — Progress Notes (Unsigned)
NEUROLOGY FOLLOW UP OFFICE NOTE  Norma Dunn [id number]  Assessment/Plan:   Migraine without aura, with status migrainosus, not intractable - left sided numbness may be new migraine aura or possibly cervicogenic   As she is having intractable daily headaches with new symptoms (dizziness, left sided numbness), will check MRI of brain with and without contrast to rule out secondary intracranial etiology Migraine prevention:  Discontinue Qulipta.  Start Emgality Migraine rescue:  rizatriptan 10mg ; Zofran for nausea.  Will have her try sample of Zavzpret to use as second line for times when rizatriptan ineffective Limit use of pain relievers to no more than 2 days out of week to prevent risk of rebound or medication-overuse headache. Keep headache diary Follow up 5 months.   Subjective:  Norma Dunn is a 47 year old woman who follows up for migraines.   UPDATE: Since last visit, she has had worsening migraines.  They started becoming persistent/constant in June.  Never without a headache.  Now more dizzy and nauseous.  Her headache has been left sided.  She received a migraine cocktail on 6/24 in the office.  She went to the ED on 7/5 for intractable migraine with left sided numbness and tingling of face and arm.  Notes tightness in left side of neck and pain shooting down to above the left elbow.  Occasional numbness into the hand.  CT head personally reviewed was unremarkable.  Will take Motrin 1 to 2 times a week.  No preceding injury.  She did have altered sleep pattern which may have been a trigger.  However, sleep remains poor due to the headaches (4-5 hours a night).    Hasn't tried samples of Zavzpret.    Current NSAIDS:  no Current analgesics:  no Current triptans:  rizatriptan 10mg  Current anti-emetic:  Zofran 4mg  Current muscle relaxants:  no Current anti-anxiolytic:  no Current sleep aide:  no Current Antihypertensive medications:  no Current Antidepressant  medications:  no Current Anticonvulsant medications:  no Current CGRP inhibitor:  Qulipta 60mg  QD Current Vitamins/Herbal/Supplements:  no Current Antihistamines/Decongestants:  meclizine Other therapy:  no   Caffeine:  rarely Alcohol:  no Smoker:  no Diet:  Hydrates.  Rarely soda.  Watches diet. Exercise:  yes Depression/anxiety:  No Other pain:  no Sleep hygiene:  Improved.     HISTORY: Onset:  Since her 66s Location:  Usually bi-frontal or either side Quality:  Pressure, sometimes sharp Initial Intensity:  Moderate to severe Aura:  no Prodrome:  forgetful Postdrome:  Sometimes fatigue Associated symptoms:  Nausea, photophobia, phonophobia, blurred vision in left eye, no vomiting.  She has not had any new worse headache of her life, waking up from sleep Initial Duration:  Usually 2 days (but up to a week) Initial Frequency:  Varies.  Some months up to 18 days per month but she may have 1 or 2 months with little or no headache. Initial Frequency of abortive medication: as needed. Triggers:  Menstrual cycle, stress Relieving factors:  Nothing Activity:  Aggravates.  Cannot function or go to work 5 days a month.   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  Past NSAIDS:  Ibuprofen 800mg  Past analgesics:  Excedrin, Fioricet Past abortive triptans:  Sumatriptan (ineffective) Past ergot:  Trudhesa NS Past muscle relaxants:  no Past anti-emetic:  Reglan (effective), Promethazine 25mg  (effective but causes drowsiness) Past antihypertensive medications:  no Past antidepressant  medications:  nortriptyline (elevated blood pressure) Past anticonvulsant medications:  no Past CGRP inhibitor:  Nurtec (rescue), Bernita Raisin  Past vitamins/Herbal/Supplements:  no Other past therapies:  no   Family history of headache:  Father (migraines)   She started having low back pain radiating down the left leg in December 2020.  She saw orthopedics.  MRI of lumbar spine on 11/20/2019 showed mild broad-based disc bulge with mild bilateral foraminal narrowing and bilateral L5 pars interarticularis defects causing  grade 1 anterolisthesis of L5 on S1. Underwent physical therapy  PAST MEDICAL HISTORY: Past Medical History:  Diagnosis Date   Asthma    Headache     MEDICATIONS: Current Outpatient Medications on File Prior to Visit  Medication Sig Dispense Refill   albuterol (VENTOLIN HFA) 108 (90 Base) MCG/ACT inhaler TAKE 2 PUFFS BY MOUTH EVERY 6 HOURS AS NEEDED FOR WHEEZE OR SHORTNESS OF BREATH 18 each 2   Atogepant (QULIPTA) 60 MG TABS Take 1 tablet (60 mg total) by mouth daily. 30 tablet 11   estradiol-norethindrone (MIMVEY) 1-0.5 MG tablet Take 1 tablet by mouth daily. 84 tablet 3   IBU 800 MG tablet Take 1 tablet by mouth 2 (two) times daily as needed.     meloxicam (MOBIC) 15 MG tablet Take 1 tablet (15 mg total) by mouth daily. 30 tablet 2   montelukast (SINGULAIR) 10 MG tablet TAKE 1 TABLET BY MOUTH EVERYDAY AT BEDTIME 90 tablet 1   ondansetron (ZOFRAN) 4 MG tablet Take 1 tablet (4 mg total) by mouth every 8 (eight) hours as needed for nausea or vomiting. 20 tablet 11   predniSONE (DELTASONE) 20 MG tablet Take 1 tablet (20 mg total) by mouth daily. 10 tablet 0   predniSONE (STERAPRED UNI-PAK 21 TAB) 10 MG (21) TBPK tablet take 60mg  day 1, then 50mg  day 2, then 40mg  day 3, then 30mg  day 4, then 20mg  day 5, then 10mg  day 6, then STOP 21 tablet 0   rizatriptan (MAXALT-MLT) 10 MG disintegrating tablet TAKE 1 TABLET BY MOUTH AS NEEDED FOR MIGRAINE.  MAY REPEAT AFTER 2 HOURS.  MAXIMUM 2  TABLETS IN 24 HOURS. 9 tablet 11   No current facility-administered medications on file prior to visit.    ALLERGIES: Allergies  Allergen Reactions   Latex Itching    FAMILY HISTORY: Family History  Problem Relation Age of Onset   Healthy Mother    Migraines Father    Healthy Brother    Healthy Child    Breast cancer Neg Hx       Objective:  There were no vitals taken for this visit. General: No acute distress.  Patient appears well-groomed.   Head:  Normocephalic/atraumatic Eyes:  Fundi examined but not visualized Neck: supple, no paraspinal tenderness, full range of motion Heart:  Regular rate and rhythm Neurological Exam: alert and oriented.  Speech fluent and not dysarthric, language intact.  Reduced left V2-V3 sensation.  Otherwise, CN II-XII intact. Bulk and tone normal, muscle strength 5/5 throughout.  Sensation to pinprick and vibration intact.  Deep tendon reflexes 2+ throughout.  Finger to nose testing intact.  Gait normal, Romberg negative.   Shon Millet, DO

## 2023-03-17 ENCOUNTER — Ambulatory Visit: Payer: Federal, State, Local not specified - PPO | Admitting: Neurology

## 2023-03-17 ENCOUNTER — Other Ambulatory Visit (HOSPITAL_COMMUNITY): Payer: Self-pay

## 2023-03-17 ENCOUNTER — Encounter: Payer: Self-pay | Admitting: Neurology

## 2023-03-17 ENCOUNTER — Telehealth: Payer: Self-pay | Admitting: Pharmacy Technician

## 2023-03-17 VITALS — BP 129/90 | HR 60

## 2023-03-17 DIAGNOSIS — R200 Anesthesia of skin: Secondary | ICD-10-CM | POA: Diagnosis not present

## 2023-03-17 DIAGNOSIS — G43011 Migraine without aura, intractable, with status migrainosus: Secondary | ICD-10-CM

## 2023-03-17 MED ORDER — EMGALITY 120 MG/ML ~~LOC~~ SOAJ: 240.0000 mg | Freq: Once | SUBCUTANEOUS | 0 refills | Status: AC

## 2023-03-17 NOTE — Patient Instructions (Addendum)
Plan to start Emgality - 2 injections for first dose, then 1 injection every 28 days thereafter.  Once you pick up first dose, then contact me and I will send in prescription for standing order.  Once you start new medication, stop Qulipta.   Ubrelvy as needed.  Limit use of pain relievers to no more than 2 days out of week to prevent risk of rebound or medication-overuse headache. Keep headache diary MRI of brain with and without contrast If neck pain persists, physical therapy

## 2023-03-17 NOTE — Telephone Encounter (Signed)
Pharmacy Patient Advocate Encounter   Received notification from Cover[employer]Meds that prior authorization for [facility] 120MG  is required/requested.   Insurance verification completed.   The patient is insured through [employer] [facility] .   Per test claim: PA required; PA submitted to [employer] [facility] via CoverMyMeds Key/confirmation #/[facility] Status is pending

## 2023-03-18 ENCOUNTER — Encounter: Payer: Self-pay | Admitting: Neurology

## 2023-03-20 ENCOUNTER — Other Ambulatory Visit: Payer: Self-pay

## 2023-03-20 MED ORDER — EMGALITY 120 MG/ML ~~LOC~~ SOAJ: 120.0000 mg | SUBCUTANEOUS | 11 refills | Status: DC

## 2023-03-21 ENCOUNTER — Encounter: Payer: Self-pay | Admitting: Neurology

## 2023-03-21 DIAGNOSIS — G43011 Migraine without aura, intractable, with status migrainosus: Secondary | ICD-10-CM

## 2023-03-29 ENCOUNTER — Other Ambulatory Visit (HOSPITAL_COMMUNITY): Payer: Self-pay

## 2023-03-29 NOTE — Telephone Encounter (Signed)
Pharmacy Patient Advocate Encounter  Received notification from [employer] [facility] that Prior Authorization for Emgality 120mg  has been APPROVED from 02/16/2023 to 09/14/2023. Ran test claim, Copay is $refill too soon. This test claim was processed through [facility]- copay amounts may vary at other pharmacies due to pharmacy/plan contracts, or as the patient moves through the different stages of their insurance plan.   Approval letter has been attached in patients media.

## 2023-04-05 ENCOUNTER — Ambulatory Visit: Payer: Federal, State, Local not specified - PPO | Admitting: Neurology

## 2023-04-07 ENCOUNTER — Ambulatory Visit: Admission: RE | Admit: 2023-04-07 | Payer: Federal, State, Local not specified - PPO | Source: Ambulatory Visit

## 2023-04-07 DIAGNOSIS — G43011 Migraine without aura, intractable, with status migrainosus: Secondary | ICD-10-CM

## 2023-04-07 MED ORDER — GADOPICLENOL 0.5 MMOL/ML IV SOLN
10.0000 mL | Freq: Once | INTRAVENOUS | Status: AC | PRN
  Administered 2023-04-07: 8 mL via INTRAVENOUS

## 2023-04-10 ENCOUNTER — Encounter: Payer: Self-pay | Admitting: Neurology

## 2023-04-12 ENCOUNTER — Telehealth: Payer: Self-pay

## 2023-04-12 NOTE — Telephone Encounter (Signed)
Mychart message from Patient,  Can someone please give me a call to go over the results of my MRI? I don't understand what the results are. Plus I have questions about it.       Per Dr.Jaffe, The MRI is unremarkable.  No concerning findings except for the Chiari malformation which is already known and an incidental finding.  We are just dealing with an unfortunate intractable migraines.  I am having many patients this summer with the same problem.  Continue current management.  There is no indication to remain out of work.

## 2023-04-19 ENCOUNTER — Telehealth: Payer: Self-pay

## 2023-04-19 DIAGNOSIS — R42 Dizziness and giddiness: Secondary | ICD-10-CM

## 2023-04-19 NOTE — Telephone Encounter (Signed)
Per Dr.Jaffe, I didn't take her out of work.  I examined her just last month when she was already having these symptoms and exam was unremarkable.  From my standpoint, I do not see any objective reason that she needs to stay out of work.  As far as work restrictions based on that form, I do not assess those requests and we send to OT for functional capacity evaluation first.  Patient wants to know will we send a referral for the OT?

## 2023-04-19 NOTE — Telephone Encounter (Signed)
I didn't take her out of work.  I examined her just last month when she was already having these symptoms and exam was unremarkable.  From my standpoint, I do not see any objective reason that she needs to stay out of work.  As far as work restrictions based on that form, I do not assess those requests and we send to OT for functional capacity evaluation first.

## 2023-04-26 ENCOUNTER — Encounter: Payer: Self-pay | Admitting: Pulmonary Disease

## 2023-04-26 ENCOUNTER — Ambulatory Visit (INDEPENDENT_AMBULATORY_CARE_PROVIDER_SITE_OTHER): Payer: Federal, State, Local not specified - PPO | Admitting: Pulmonary Disease

## 2023-04-26 VITALS — BP 120/80 | HR 81 | Temp 98.2°F | Ht 65.0 in | Wt 200.2 lb

## 2023-04-26 DIAGNOSIS — J4520 Mild intermittent asthma, uncomplicated: Secondary | ICD-10-CM

## 2023-04-26 NOTE — Progress Notes (Signed)
Subjective:    Patient ID: Norma Dunn, female    DOB: 05/01/1976, 47 y.o.   MRN: [id number]  Patient Care Team: [facility], [facility] as PCP - General (General Practice) Drema Dallas, DO as Consulting Physician (Neurology)  Chief Complaint  Patient presents with   Follow-up    No SOB, wheezing or cough.    HPI Patient is a 47 year old very remote former smoker (cigars, quit 1999) who presents for follow-up on mild intermittent asthma without complication.  Patient presents for yearly follow-up.  He is currently maintained on albuterol as needed and Singulair for management of chronic rhinitis.  Last seen on 21 April 2022 since that visit she has not had any exacerbations.  Previously she had been having yearly exacerbations.  She states that usually spring time is the worst time for her.  [facility] is [facility] at the [facility].  She has not had any recent fevers, chills or sweats.  No cough or sputum production.  She uses albuterol rescue rarely.  She does not feel she needs daily inhaler.  Overall she feels well and looks well.  DATA 02/10/2021 PFTs: FEV1 2.48 L or 97% predicted, FVC 2.95 L or 94% predicted, FEV1/FVC 84%, lung volumes normal.  Diffusion capacity corrects for alveolar volume.  Normal study overall. 11/17/2022 PFTs: FEV1 2.44 L or 81% predicted, FVC 3.06 L or 81% predicted, lung volumes within normal limits.  There is significant bronchodilator response noted by decreased airway resistance.  Diffusion capacity is normal.  Consistent with minimal obstruction, asthmatic type  Review of Systems A 10 point review of systems was performed and it is as noted above otherwise negative.   Patient Active Problem List   Diagnosis Date Noted   Pelvic pain in female 04/21/2022   Mild intermittent asthma without complication 04/21/2022   Personal history of COVID-19 04/21/2022    Social History   Tobacco Use   Smoking status: Former    Types:  Cigars    Quit date: 08/22/1997    Years since quitting: 25.6   Smokeless tobacco: Never   Tobacco comments:    not a daily smoker.  Substance Use Topics   Alcohol use: No    Allergies  Allergen Reactions   Latex Itching    Current Meds  Medication Sig   albuterol (VENTOLIN HFA) 108 (90 Base) MCG/ACT inhaler TAKE 2 PUFFS BY MOUTH EVERY 6 HOURS AS NEEDED FOR WHEEZE OR SHORTNESS OF BREATH   estradiol-norethindrone (MIMVEY) 1-0.5 MG tablet Take 1 tablet by mouth daily.   Galcanezumab-gnlm (EMGALITY) 120 MG/ML SOAJ Inject 120 mg into the skin every 28 (twenty-eight) days.   IBU 800 MG tablet Take 1 tablet by mouth 2 (two) times daily as needed.   meclizine (ANTIVERT) 25 MG tablet Take 25 mg by mouth as needed.   meloxicam (MOBIC) 15 MG tablet Take 1 tablet (15 mg total) by mouth daily.   montelukast (SINGULAIR) 10 MG tablet TAKE 1 TABLET BY MOUTH EVERYDAY AT BEDTIME   ondansetron (ZOFRAN) 4 MG tablet Take 1 tablet (4 mg total) by mouth every 8 (eight) hours as needed for nausea or vomiting.   rizatriptan (MAXALT-MLT) 10 MG disintegrating tablet TAKE 1 TABLET BY MOUTH AS NEEDED FOR MIGRAINE.  MAY REPEAT AFTER 2 HOURS.  MAXIMUM 2 TABLETS IN 24 HOURS.    Immunization History  Administered Date(s) Administered   Influenza,inj,Quad PF,6+ Mos 05/02/2017   Influenza-Unspecified 04/29/2019, 05/19/2021   Janssen (J&J) SARS-COV-2 Vaccination 01/08/2020  Objective:     BP 120/80 (BP Location: Right Arm, Cuff Size: Normal)   Pulse 81   Temp 98.2 F (36.8 C)   Ht 5\' 5"  (1.651 m)   Wt 200 lb 3.2 oz (90.8 kg)   SpO2 97%   BMI 33.32 kg/m   SpO2: 97 % O2 Device: None (Room air)  GENERAL: Well-developed, overweight woman, no acute distress fully ambulatory, no conversational dyspnea HEAD: Normocephalic, atraumatic.  EYES: Pupils equal, round, reactive to light.  No scleral icterus.  MOUTH: Dentition intact, oral mucosa moist. NECK: Supple. No thyromegaly. Trachea midline. No JVD.   No adenopathy. PULMONARY: Good air entry bilaterally.  No adventitious sounds. CARDIOVASCULAR: S1 and S2. Regular rate and rhythm.  No rubs, murmurs or gallops heard. ABDOMEN: Benign. MUSCULOSKELETAL: No joint deformity, no clubbing, no edema.  NEUROLOGIC: No focal deficits, no gait disturbance, speech is fluent. SKIN: Intact,warm,dry.  On limited exam, no rashes. PSYCH: Mood and behavior are normal.   Assessment & Plan:     ICD-10-CM   1. Mild intermittent asthma without complication  J45.20    Continue as needed albuterol Continue Singulair     Will see the patient in follow-up in a years time.  She is to contact us prior to that time should any new difficulties arise.  She was advised to get immunizations up-to-date particularly flu vaccine this year.   Gailen Shelter, MD Advanced Bronchoscopy PCCM [facility]    *This note was dictated using voice recognition software/Dragon.  Despite best efforts to proofread, errors can occur which can change the meaning. Any transcriptional errors that result from this process are unintentional and may not be fully corrected at the time of dictation.

## 2023-04-26 NOTE — Patient Instructions (Addendum)
You are doing well.  Your lungs sounded clear today.  Make sure you get your flu vaccine this year.  Continue using your rescue inhaler as needed and your montelukast.  Let us know if you have any difficulties.  Will see you in follow-up in a year, call sooner should any new problems arise.

## 2023-04-27 ENCOUNTER — Ambulatory Visit: Payer: Federal, State, Local not specified - PPO | Attending: Neurology

## 2023-04-27 DIAGNOSIS — R2681 Unsteadiness on feet: Secondary | ICD-10-CM | POA: Diagnosis present

## 2023-04-27 DIAGNOSIS — R42 Dizziness and giddiness: Secondary | ICD-10-CM | POA: Insufficient documentation

## 2023-04-27 NOTE — Therapy (Signed)
OUTPATIENT PHYSICAL THERAPY VESTIBULAR EVALUATION     Patient Name: Norma Dunn MRN: [id number] DOB:1976/05/07, 47 y.o., female Today's Date: 04/27/2023  END OF SESSION:  PT End of Session - 04/27/23 1133     Visit Number 1    Number of Visits 9    Date for PT Re-Evaluation 05/26/23    Authorization Type BCBS    PT Start Time 1146    PT Stop Time 1228    PT Time Calculation (min) 42 min    Activity Tolerance Patient tolerated treatment well    Behavior During Therapy [facility] for tasks assessed/performed             Past Medical History:  Diagnosis Date   Asthma    Headache    Past Surgical History:  Procedure Laterality Date   OOPHORECTOMY  2005   right   TUBAL LIGATION     left   TUBAL LIGATION     left   Patient Active Problem List   Diagnosis Date Noted   Pelvic pain in female 04/21/2022   Mild intermittent asthma without complication 04/21/2022   Personal history of COVID-19 04/21/2022    [facility] REFERRING PROVIDER: Shon Millet, DO  REFERRING DIAG: R42 (ICD-10-CM) - Dizziness   THERAPY DIAG:  Unsteadiness on feet - Plan: PT plan of care cert/re-cert  Dizziness and giddiness - Plan: PT plan of care cert/re-cert  ONSET DATE: 04/20/2023 referral  Rationale for Evaluation and Treatment: Rehabilitation  SUBJECTIVE:   SUBJECTIVE STATEMENT: Patient arrives to clinic with mother, no AD. Patient still having migraines on and off, now more with dizziness. Can't turn her head quickly, riding in a car is not good- needs to look down. This started in July- had to come out of work more so due to dizziness, which she realizes now. Vision is blurry, room is spinning. Has been dealing with migraines for years, but this is the worst. Describes migraine as mainly pain, but when prompted reports photo, sound and smell sensitivity.  Pt accompanied by: family member  PERTINENT HISTORY: migraines  PAIN:  Are you having pain? Yes: NPRS  scale: 5/10 Pain location: migraine HA Pain description: dull  PRECAUTIONS: Fall   WEIGHT BEARING RESTRICTIONS: No  FALLS: Has patient fallen in last 6 months? Yes. Number of falls 3-4; slipped down a hill, walking dogs might have tripped over something, walking dogs and fell backward onto ground  PATIENT GOALS: "to get back to normal"  OBJECTIVE:   DIAGNOSTIC FINDINGS: 04/07/23 brain MRI IMPRESSION: 1. Unchanged Chiari I malformation. 2. Otherwise unremarkable appearance of the brain.  COGNITION: Overall cognitive status: Within functional limits for tasks assessed   SENSATION: WFL  POSTURE:  rounded shoulders, forward head, increased thoracic kyphosis, posterior pelvic tilt, and flexed trunk   Cervical ROM:   WFL, pain free  STRENGTH: WFL  BED MOBILITY:  Reports independent, but dizzy with all movements  GAIT: Gait pattern: step through pattern and wide BOS cautious Distance walked: clinic  PATIENT SURVEYS:  FOTO 39; expected to be at 58  VESTIBULAR ASSESSMENT:  GENERAL OBSERVATION: NAD, no AD   SYMPTOM BEHAVIOR:  Subjective history: see above  Non-Vestibular symptoms: changes in hearing, changes in vision, headaches, nausea/vomiting, and migraine symptoms  Type of dizziness: Spinning/Vertigo, Lightheadedness/Faint, and "confused"  Frequency: 75-80% of every day   Duration: hours- days  Aggravating factors: Spontaneous, Induced by position change: lying supine, rolling to the right, rolling to the left, supine to sit,  and sit to stand, Worse outside or in busy environment, Occurs when standing still , and driving in car  Relieving factors: medication, closing eyes, rest, and slow movements  Progression of symptoms: unchanged  OCULOMOTOR EXAM:  Ocular Alignment: normal  Ocular ROM: No Limitations  Spontaneous Nystagmus: absent  Gaze-Induced Nystagmus: absent  Smooth Pursuits: intact  Saccades: hypometric/undershoots and extra eye  movements  Convergence/Divergence: ~12 cm   VESTIBULAR - OCULAR REFLEX:   Slow VOR: Normal 3-4/5 dizziness  VOR Cancellation: Normal 3-4/5 dizziness  Head-Impulse Test: HIT Right: negative HIT Left: negative 3/5 dizziness with both   Dynamic Visual Acuity: to be assessed    POSITIONAL TESTING: Right Roll Test: no nystagmus Left Roll Test: no nystagmus  MOTION SENSITIVITY:  Motion Sensitivity Quotient Intensity: 0 = none, 1 = Lightheaded, 2 = Mild, 3 = Moderate, 4 = Severe, 5 = Vomiting  Intensity  1. Sitting to supine 0  2. Supine to L side 2.5  3. Supine to R side 2.5  4. Supine to sitting 3.5  5. L Hallpike-Dix   6. Up from L    7. R Hallpike-Dix   8. Up from R    9. Sitting, head tipped to L knee 0  10. Head up from L knee 3.5  11. Sitting, head tipped to R knee 0  12. Head up from R knee 3.5  13. Sitting head turns x5 4  14.Sitting head nods x5 4  15. In stance, 180 turn to L  2.5  16. In stance, 180 turn to R 2.5     VESTIBULAR TREATMENT:                                                                                                   N/a  PATIENT EDUCATION: Education details: PT POC, exam findings, not taking meclizine prior to next appt if possible  Person educated: Patient and Parent Education method: Explanation Education comprehension: verbalized understanding and needs further education  HOME EXERCISE PROGRAM:  GOALS: Goals reviewed with patient? Yes  SHORT TERM GOALS: = LTG based on PT POC length  LONG TERM GOALS: Target date: 05/26/23  Pt will be independent with final HEP for improved symptoms report  Baseline: to be provided Goal status: INITIAL  2.  Pt will report </= 2/5 for all movements on MSQ to indicate improvement in motion sensitivity and improved activity tolerance.   Baseline: up to 4/5 Goal status: INITIAL  3.  DVA goal  Baseline: to be assessed Goal status: INITIAL  4.  Patient will score >/= 58 on FOTO to demonstrate  improved symptom report Baseline: 39 Goal status: INITIAL  5.  SOT goal Baseline: to be assessed Goal status: INITIAL   ASSESSMENT:  CLINICAL IMPRESSION: Patient is a 47 y.o. female who was seen today for physical therapy evaluation and treatment for dizziness. She describes rather sudden onset of dizziness over the summer that may correlate with the onset of a profound migraine. She has a h/o of migraines, but never vestibular in nature. Vestibular assessment essentially normal except for convergence and noted  motion sensitivity. Patient describing greater difficulty with looking at an object close up and then lifting her head up to see far, consistent with impaired convergence. She would benefit from skilled PT services to address the above mentioned deficits.    OBJECTIVE IMPAIRMENTS: decreased knowledge of condition, dizziness, and pain.   ACTIVITY LIMITATIONS: carrying, lifting, standing, stairs, bed mobility, reach over head, hygiene/grooming, locomotion level, and caring for others  PARTICIPATION LIMITATIONS: cleaning, interpersonal relationship, driving, shopping, community activity, occupation, and yard work  PERSONAL FACTORS: Age, Behavior pattern, Past/current experiences, Sex, Time since onset of injury/illness/exacerbation, Transportation, and 1 comorbidity: migraines  are also affecting patient's functional outcome.   REHAB POTENTIAL: Fair time since onset  CLINICAL DECISION MAKING: Evolving/moderate complexity  EVALUATION COMPLEXITY: Moderate   PLAN:  PT FREQUENCY: 2x/week  PT DURATION: 4 weeks  PLANNED INTERVENTIONS: Therapeutic exercises, Therapeutic activity, Neuromuscular re-education, Balance training, Gait training, Patient/Family education, Self Care, Joint mobilization, Stair training, Vestibular training, Canalith repositioning, Visual/preceptual remediation/compensation, DME instructions, Aquatic Therapy, Manual therapy, and Re-evaluation  PLAN FOR NEXT  SESSION: finish positional testing?, DVA, HEP, habituation, SOT   Westley Foots, PT Westley Foots, PT, DPT, CBIS  04/27/2023, 12:45 PM

## 2023-04-28 ENCOUNTER — Encounter: Payer: Self-pay | Admitting: Neurology

## 2023-04-28 DIAGNOSIS — Z0279 Encounter for issue of other medical certificate: Secondary | ICD-10-CM

## 2023-05-01 ENCOUNTER — Encounter: Payer: Self-pay | Admitting: Physical Therapy

## 2023-05-01 ENCOUNTER — Ambulatory Visit: Payer: Federal, State, Local not specified - PPO | Admitting: Physical Therapy

## 2023-05-01 ENCOUNTER — Other Ambulatory Visit: Payer: Self-pay | Admitting: Neurology

## 2023-05-01 VITALS — BP 127/88 | HR 88

## 2023-05-01 DIAGNOSIS — R2681 Unsteadiness on feet: Secondary | ICD-10-CM | POA: Diagnosis not present

## 2023-05-01 DIAGNOSIS — R42 Dizziness and giddiness: Secondary | ICD-10-CM

## 2023-05-01 NOTE — Therapy (Signed)
OUTPATIENT PHYSICAL THERAPY VESTIBULAR TREATMENT     Patient Name: Norma Dunn MRN: [id number] DOB:06/17/1976, 47 y.o., female Today's Date: 05/01/2023  END OF SESSION:  PT End of Session - 05/01/23 1410     Visit Number 2    Number of Visits 9    Date for PT Re-Evaluation 05/26/23    Authorization Type BCBS    PT Start Time 1408    PT Stop Time 1449    PT Time Calculation (min) 41 min    Equipment Utilized During Treatment Gait belt    Activity Tolerance Patient tolerated treatment well    Behavior During Therapy WFL for tasks assessed/performed             Past Medical History:  Diagnosis Date   Asthma    Headache    Past Surgical History:  Procedure Laterality Date   OOPHORECTOMY  2005   right   TUBAL LIGATION     left   TUBAL LIGATION     left   Patient Active Problem List   Diagnosis Date Noted   Pelvic pain in female 04/21/2022   Mild intermittent asthma without complication 04/21/2022   Personal history of COVID-19 04/21/2022    [facility] REFERRING PROVIDER: Shon Millet, DO  REFERRING DIAG: R42 (ICD-10-CM) - Dizziness   THERAPY DIAG:  Unsteadiness on feet  Dizziness and giddiness  ONSET DATE: 04/20/2023 referral  Rationale for Evaluation and Treatment: Rehabilitation  SUBJECTIVE:   SUBJECTIVE STATEMENT: Patient reports that she currently has a migraine that started yesterday last night. Patient reports that her dizziness has also been worse since then. Denies falls/near falls. Patient reports that she did not take meclizine.  Pt accompanied by: family member  PERTINENT HISTORY: migraines  PAIN:  Are you having pain? Yes: NPRS scale: 8/10 Pain location: migraine HA Pain description: dull  PRECAUTIONS: Fall   WEIGHT BEARING RESTRICTIONS: No  FALLS: Has patient fallen in last 6 months? Yes. Number of falls 3-4; slipped down a hill, walking dogs might have tripped over something, walking dogs and fell  backward onto ground  PATIENT GOALS: "to get back to normal"  OBJECTIVE:   DIAGNOSTIC FINDINGS: 04/07/23 brain MRI IMPRESSION: 1. Unchanged Chiari I malformation. 2. Otherwise unremarkable appearance of the brain.  COGNITION: Overall cognitive status: Within functional limits for tasks assessed   SENSATION: WFL  POSTURE:  rounded shoulders, forward head, increased thoracic kyphosis, posterior pelvic tilt, and flexed trunk   Cervical ROM:   WFL, pain free  STRENGTH: WFL  BED MOBILITY:  Reports independent, but dizzy with all movements  GAIT: Gait pattern: step through pattern and wide BOS cautious Distance walked: clinic  PATIENT SURVEYS:  FOTO 39; expected to be at 58  VESTIBULAR ASSESSMENT:  GENERAL OBSERVATION: NAD, no AD   SYMPTOM BEHAVIOR:  Subjective history: see above  Non-Vestibular symptoms: changes in hearing, changes in vision, headaches, nausea/vomiting, and migraine symptoms  Type of dizziness: Spinning/Vertigo, Lightheadedness/Faint, and "confused"  Frequency: 75-80% of every day   Duration: hours- days  Aggravating factors: Spontaneous, Induced by position change: lying supine, rolling to the right, rolling to the left, supine to sit, and sit to stand, Worse outside or in busy environment, Occurs when standing still , and driving in car  Relieving factors: medication, closing eyes, rest, and slow movements  Progression of symptoms: unchanged  OCULOMOTOR EXAM:  Ocular Alignment: normal  Ocular ROM: No Limitations  Spontaneous Nystagmus: absent  Gaze-Induced Nystagmus: absent  Smooth  Pursuits: intact  Saccades: hypometric/undershoots and extra eye movements  Convergence/Divergence: ~12 cm   VESTIBULAR - OCULAR REFLEX:   Slow VOR: Normal 3-4/5 dizziness  VOR Cancellation: Normal 3-4/5 dizziness  Head-Impulse Test: HIT Right: negative HIT Left: negative 3/5 dizziness with both   Dynamic Visual Acuity: to be assessed    POSITIONAL TESTING:  Right Roll Test: no nystagmus Left Roll Test: no nystagmus  MOTION SENSITIVITY:  Motion Sensitivity Quotient Intensity: 0 = none, 1 = Lightheaded, 2 = Mild, 3 = Moderate, 4 = Severe, 5 = Vomiting  Intensity  1. Sitting to supine 0  2. Supine to L side 2.5  3. Supine to R side 2.5  4. Supine to sitting 3.5  5. L Hallpike-Dix   6. Up from L    7. R Hallpike-Dix   8. Up from R    9. Sitting, head tipped to L knee 0  10. Head up from L knee 3.5  11. Sitting, head tipped to R knee 0  12. Head up from R knee 3.5  13. Sitting head turns x5 4  14.Sitting head nods x5 4  15. In stance, 180 turn to L  2.5  16. In stance, 180 turn to R 2.5    VESTIBULAR TREATMENT:                                                                                                    Vitals:   05/01/23 1415  BP: 127/88  Pulse: 88   NMR:   Positional Testing: L Dix Hallpike: negative R Dix Hallpike: negative Roll Test to L: negative Roll Test to R: negative  Dynamic Visual Acuity: Static: Line 9 Dynamic: Line 7 Results: 2 Line Difference WFL  Sensory Organization Test Component 1: 3/3 passed  Component 2: 3/3 passed Component 3: 3/3 passed Component 4: 0/3 passed, 0 falls Component 5: 0/3 passed, 3 falls Component 6: 0/3 passed, 0 falls  Visual: Below age matched norms Vestibular: Well below age matched norms Somatosensory: WFL   Composite: Below age matched norms 77  PATIENT EDUCATION: Education details: SOT results and examination findgins Person educated: Patient and Parent Education method: Explanation Education comprehension: verbalized understanding and needs further education  HOME EXERCISE PROGRAM:  To be provided  GOALS: Goals reviewed with patient? Yes  SHORT TERM GOALS: = LTG based on PT POC length  LONG TERM GOALS: Target date: 05/26/23  Pt will be independent with final HEP for improved symptoms report  Baseline: to be provided Goal status: INITIAL  2.  Pt  will report </= 2/5 for all movements on MSQ to indicate improvement in motion sensitivity and improved activity tolerance.   Baseline: up to 4/5 Goal status: INITIAL  3.  DVA goal  Baseline: 2 line difference Goal status: Discontinued WFL  4.  Patient will score >/= 58 on FOTO to demonstrate improved symptom report Baseline: 39 Goal status: INITIAL  5.  Patient will improve SOT goal by 5 points to demonstrate progress towards improved integration of visual, vestibular, and somatosensory balance systems in order to return to  PLOF.   Baseline: 50  Goal status: INITIAL   ASSESSMENT:  CLINICAL IMPRESSION: Session emphasized remainder of vestibular assessment with emphasis on SOT. Patient positional testing WFL; patient found to have significant values below age matched norms on vestibular portion of SOT and moderate below norms on visual component. DVA also grossly WNL. She would benefit from skilled PT services to address the above mentioned deficits. Continue POC.   OBJECTIVE IMPAIRMENTS: decreased knowledge of condition, dizziness, and pain.   ACTIVITY LIMITATIONS: carrying, lifting, standing, stairs, bed mobility, reach over head, hygiene/grooming, locomotion level, and caring for others  PARTICIPATION LIMITATIONS: cleaning, interpersonal relationship, driving, shopping, community activity, occupation, and yard work  PERSONAL FACTORS: Age, Behavior pattern, Past/current experiences, Sex, Time since onset of injury/illness/exacerbation, Transportation, and 1 comorbidity: migraines  are also affecting patient's functional outcome.   REHAB POTENTIAL: Fair time since onset  CLINICAL DECISION MAKING: Evolving/moderate complexity  EVALUATION COMPLEXITY: Moderate   PLAN:  PT FREQUENCY: 2x/week  PT DURATION: 4 weeks  PLANNED INTERVENTIONS: Therapeutic exercises, Therapeutic activity, Neuromuscular re-education, Balance training, Gait training, Patient/Family education, Self Care,  Joint mobilization, Stair training, Vestibular training, Canalith repositioning, Visual/preceptual remediation/compensation, DME instructions, Aquatic Therapy, Manual therapy, and Re-evaluation  PLAN FOR NEXT SESSION: HEP with emphasis on components of SOT with vestibular/visual imput on compliant surface, habituation with MSQ tasks, VOR x 1 viewing   Carmelia Bake, PT, DPT  05/01/2023, 5:09 PM

## 2023-05-03 ENCOUNTER — Ambulatory Visit: Payer: Federal, State, Local not specified - PPO

## 2023-05-03 VITALS — BP 147/96

## 2023-05-03 DIAGNOSIS — R42 Dizziness and giddiness: Secondary | ICD-10-CM

## 2023-05-03 DIAGNOSIS — R2681 Unsteadiness on feet: Secondary | ICD-10-CM | POA: Diagnosis not present

## 2023-05-03 NOTE — Therapy (Signed)
OUTPATIENT PHYSICAL THERAPY VESTIBULAR TREATMENT     Patient Name: Norma Dunn MRN: [id number] DOB:July 04, 1976, 47 y.o., female Today's Date: 05/03/2023  END OF SESSION:  PT End of Session - 05/03/23 0847     Visit Number 3    Number of Visits 9    Date for PT Re-Evaluation 05/26/23    Authorization Type BCBS    PT Start Time 0845    PT Stop Time 0927    PT Time Calculation (min) 42 min    Activity Tolerance Patient tolerated treatment well    Behavior During Therapy [facility] for tasks assessed/performed             Past Medical History:  Diagnosis Date   Asthma    Headache    Past Surgical History:  Procedure Laterality Date   OOPHORECTOMY  2005   right   TUBAL LIGATION     left   TUBAL LIGATION     left   Patient Active Problem List   Diagnosis Date Noted   Pelvic pain in female 04/21/2022   Mild intermittent asthma without complication 04/21/2022   Personal history of COVID-19 04/21/2022    [facility] REFERRING PROVIDER: Shon Millet, DO  REFERRING DIAG: R42 (ICD-10-CM) - Dizziness   THERAPY DIAG:  Unsteadiness on feet  Dizziness and giddiness  ONSET DATE: 04/20/2023 referral  Rationale for Evaluation and Treatment: Rehabilitation  SUBJECTIVE:   SUBJECTIVE STATEMENT: Patient reports doing better. 2/5 dizziness currently. Denies current migraine. Took 1/2 a meclizine after last appt, and hasn't taken one since. Denies falls.   Pt accompanied by: family member  PERTINENT HISTORY: migraines  PAIN:  Are you having pain? No  PRECAUTIONS: Fall   PATIENT GOALS: "to get back to normal"  OBJECTIVE:   DIAGNOSTIC FINDINGS: 04/07/23 brain MRI IMPRESSION: 1. Unchanged Chiari I malformation. 2. Otherwise unremarkable appearance of the brain.  VESTIBULAR ASSESSMENT:   VESTIBULAR TREATMENT:                                                                                                    Vitals:   05/03/23 0854  BP:  (!) 147/96    NMR:  -HEP (see below)   -head shakes: 3/5 dizziness  -head nods: 3/5 dizziness  -bending down: 2.5/5  -VOR x1 horizontal: 2/5  -VOR x1 vertical: 1.5/5 -VOR cancellation horizontal: 1/5 -VOR cancellation vertical: 1/5  -VORx2 horizontal: 1/5 -VOR x2 vertical: 2/5 -reading card on cones-> placing on shelf-> 1 letter of [employer]a [employer] Chart  PATIENT EDUCATION: Education details: initial HEP Person educated: Patient and Parent Education method: Explanation Education comprehension: verbalized understanding and needs further education  HOME EXERCISE PROGRAM: Head Motion: Side to Side    Sitting, tilt head down 15-30, slowly move head to right with eyes open. Hold position until symptoms subside. Then, move head slowly to opposite side. Hold position until symptoms subside. Repeat __10__ times per session. Do __3__ sessions per day. Head Motion: Up and Down    Sitting, slowly move head up with eyes open. Hold position until symptoms subside. Then,  move head in opposite direction. Hold position until symptoms subside. Repeat __10__ times per session. Do ___3_ sessions per day. Bending / Picking Up Objects    Sitting, slowly bend head down and pick up object on the floor. Return to upright position. Hold position until symptoms subside. Repeat ___8_ times per session. Do ___3_ sessions per day. Gaze Stabilization: Sitting    Keeping eyes on target on wall 5 feet away, tilt head down 15-30 and move head side to side for _30___ seconds. Repeat while moving head up and down for __30__ seconds. Do _3___ sessions per day.  GOALS: Goals reviewed with patient? Yes  SHORT TERM GOALS: = LTG based on PT POC length  LONG TERM GOALS: Target date: 05/26/23  Pt will be independent with final HEP for improved symptoms report  Baseline: to be provided Goal status: INITIAL  2.  Pt will report </= 2/5 for all movements on MSQ to indicate improvement in motion sensitivity and  improved activity tolerance.   Baseline: up to 4/5 Goal status: INITIAL  3.  DVA goal  Baseline: 2 line difference Goal status: Discontinued WFL  4.  Patient will score >/= 58 on FOTO to demonstrate improved symptom report Baseline: 39 Goal status: INITIAL  5.  Patient will improve SOT goal by 5 points to demonstrate progress towards improved integration of visual, vestibular, and somatosensory balance systems in order to return to PLOF.   Baseline: 50  Goal status: INITIAL   ASSESSMENT:  CLINICAL IMPRESSION: Patient seen for skilled PT session with emphasis on vestibular retraining. Provided patient with initial seated HEP based on MSQ results. She reported minimal exacerbation to her symptoms today, but also did not actively have a migraine this session. Remainder of session focused on job-specific duties in regards to habituation. Continue POC.   OBJECTIVE IMPAIRMENTS: decreased knowledge of condition, dizziness, and pain.   ACTIVITY LIMITATIONS: carrying, lifting, standing, stairs, bed mobility, reach over head, hygiene/grooming, locomotion level, and caring for others  PARTICIPATION LIMITATIONS: cleaning, interpersonal relationship, driving, shopping, community activity, occupation, and yard work  PERSONAL FACTORS: Age, Behavior pattern, Past/current experiences, Sex, Time since onset of injury/illness/exacerbation, Transportation, and 1 comorbidity: migraines  are also affecting patient's functional outcome.   REHAB POTENTIAL: Fair time since onset  CLINICAL DECISION MAKING: Evolving/moderate complexity  EVALUATION COMPLEXITY: Moderate   PLAN:  PT FREQUENCY: 2x/week  PT DURATION: 4 weeks  PLANNED INTERVENTIONS: Therapeutic exercises, Therapeutic activity, Neuromuscular re-education, Balance training, Gait training, Patient/Family education, Self Care, Joint mobilization, Stair training, Vestibular training, Canalith repositioning, Visual/preceptual  remediation/compensation, DME instructions, Aquatic Therapy, Manual therapy, and Re-evaluation  PLAN FOR NEXT SESSION: Progress VOR, post office- like tasks   Westley Foots, PT Westley Foots, PT, DPT, CBIS   05/03/2023, 9:37 AM

## 2023-05-08 ENCOUNTER — Telehealth: Payer: Self-pay

## 2023-05-08 ENCOUNTER — Ambulatory Visit: Payer: Federal, State, Local not specified - PPO | Admitting: Physical Therapy

## 2023-05-08 ENCOUNTER — Encounter: Payer: Self-pay | Admitting: Physical Therapy

## 2023-05-08 DIAGNOSIS — R2681 Unsteadiness on feet: Secondary | ICD-10-CM | POA: Diagnosis not present

## 2023-05-08 DIAGNOSIS — R42 Dizziness and giddiness: Secondary | ICD-10-CM

## 2023-05-08 NOTE — Telephone Encounter (Signed)
Open in error

## 2023-05-08 NOTE — Therapy (Signed)
OUTPATIENT PHYSICAL THERAPY VESTIBULAR TREATMENT     Patient Name: Norma Dunn MRN: [id number] DOB:[birth year], 47 y.o., female Today's Date: 05/08/2023  END OF SESSION:  PT End of Session - 05/08/23 1900     Visit Number 4    Number of Visits 9    Date for PT Re-Evaluation 05/26/23    Authorization Type BCBS    PT Start Time 1019    PT Stop Time 1103    PT Time Calculation (min) 44 min    Activity Tolerance Patient tolerated treatment well    Behavior During Therapy WFL for tasks assessed/performed              Past Medical History:  Diagnosis Date   Asthma    Headache    Past Surgical History:  Procedure Laterality Date   OOPHORECTOMY  2005   right   TUBAL LIGATION     left   TUBAL LIGATION     left   Patient Active Problem List   Diagnosis Date Noted   Pelvic pain in female 04/21/2022   Mild intermittent asthma without complication 04/21/2022   Personal history of COVID-19 04/21/2022    [facility] REFERRING PROVIDER: Shon Millet, DO  REFERRING DIAG: R42 (ICD-10-CM) - Dizziness   THERAPY DIAG:  Dizziness and giddiness  Unsteadiness on feet  ONSET DATE: 04/20/2023 referral  Rationale for Evaluation and Treatment: Rehabilitation  SUBJECTIVE:   SUBJECTIVE STATEMENT: Patient reports she was very dizzy yesterday so she didn't do her HEP; says she overdid it with the exercises on Saturday, so she held off on doing them yesterday.  Pt reports dizziness rating 3/10 at start of session - says she woke up with headache - took the medication and that may resolved it  Pt accompanied by: family member  PERTINENT HISTORY: migraines  PAIN:  Are you having pain? No  PRECAUTIONS: Fall   PATIENT GOALS: "to get back to normal"  OBJECTIVE:   DIAGNOSTIC FINDINGS: 04/07/23 brain MRI IMPRESSION: 1. Unchanged Chiari I malformation. 2. Otherwise unremarkable appearance of the brain.  VESTIBULAR ASSESSMENT:   VESTIBULAR  TREATMENT:                                                                                                    NeuroRe-ed:  Progressed x1 viewing to standing position - target 4' away on plain background; pt performed horizontal 30 secs x 1 rep; vertical 30 secs x1 rep; Dizziness 3/10 upon completion of both directions  Pt performed x2 viewing for 1" in standing; progressed to amb. 35' x 2 reps performing x2 viewing; pt reported dizziness 3/10 after this activity  VOR cancellation while walking 30' x 1 rep - dizziness 3.5/10  Pt stood on [employer] in corner - used 2 targets - on Rt and Lt sides - pt performed horizontal and vertical head turns 10 reps each direction; performed head turns with EC horizontal & vertical 5 reps each  Simulated work activity for filing mail - pt stood on floor facing balance corner area - used 2 [employer], 1  on Rt wall and 1 on Lt wall and "X" target on middle wall - pt read letters on [place] chart on Lt, "X" and letters on [place] chart on Rt side - letters in various sequences for improved visual scanning - dizziness reported at 3.5 intensity after activity  Habituation exercise - Edwyna Shell charts taped to 2 cones - 1 cone on floor - read letter, picked cone up off floor, turned 180 degrees to read letter on [place] chart taped to cone (held in various locations by PT) and then turned to place cone back down on floor - approx. 5 reps  Pt performed amb. Approx. 35' tossing ball straight up; amb. Making circles clockwise tracking ball 30' x 1 rep, then counterclockwise 30' x 1 rep, letter "+" pattern 30' x 1 reps - pt reported dizziness 3.5/10 dizziness with mild unsteadiness noted but no complete LOB   PATIENT EDUCATION: Education details: progressed x1 viewing exercise to standing position - gradually increase time to 60 secs/session (05-08-23, SD) Person educated: Patient and Parent Education method: Explanation Education comprehension: verbalized understanding and needs further  education  HOME EXERCISE PROGRAM: Head Motion: Side to Side    Sitting, tilt head down 15-30, slowly move head to right with eyes open. Hold position until symptoms subside. Then, move head slowly to opposite side. Hold position until symptoms subside. Repeat __10__ times per session. Do __3__ sessions per day. Head Motion: Up and Down    Sitting, slowly move head up with eyes open. Hold position until symptoms subside. Then, move head in opposite direction. Hold position until symptoms subside. Repeat __10__ times per session. Do ___3_ sessions per day. Bending / Picking Up Objects    Sitting, slowly bend head down and pick up object on the floor. Return to upright position. Hold position until symptoms subside. Repeat ___8_ times per session. Do ___3_ sessions per day. Gaze Stabilization: Sitting    Keeping eyes on target on wall 5 feet away, tilt head down 15-30 and move head side to side for _30___ seconds. Repeat while moving head up and down for __30__ seconds. Do _3___ sessions per day.  GOALS: Goals reviewed with patient? Yes  SHORT TERM GOALS: = LTG based on PT POC length  LONG TERM GOALS: Target date: 05/26/23  Pt will be independent with final HEP for improved symptoms report  Baseline: to be provided Goal status: INITIAL  2.  Pt will report </= 2/5 for all movements on MSQ to indicate improvement in motion sensitivity and improved activity tolerance.   Baseline: up to 4/5 Goal status: INITIAL  3.  DVA goal  Baseline: 2 line difference Goal status: Discontinued WFL  4.  Patient will score >/= 58 on FOTO to demonstrate improved symptom report Baseline: 39 Goal status: INITIAL  5.  Patient will improve SOT goal by 5 points to demonstrate progress towards improved integration of visual, vestibular, and somatosensory balance systems in order to return to PLOF.   Baseline: 50  Goal status: INITIAL   ASSESSMENT:  CLINICAL IMPRESSION: PT session  focused on visual/vestibular exercises and habituation activities as simulated work activities with emphasis on visual scanning.  Pt reported dizziness 3-3.5/10 during session with pt most challenged with tracking ball CW and CCW directions during ambulation with mild unsteadiness noted but no complete LOB.  Pt denied having migraine in today's session; pt tolerated visual scanning exercises well with dizziness remaining 3-3.5/10 incorporated with habituation exercise of bending down to retrieve cone off floor, turning 180  degrees and reading letter at variou heights.  Dizziness intensity rating did not increase >3.5/10 during session.  Continue POC.   OBJECTIVE IMPAIRMENTS: decreased knowledge of condition, dizziness, and pain.   ACTIVITY LIMITATIONS: carrying, lifting, standing, stairs, bed mobility, reach over head, hygiene/grooming, locomotion level, and caring for others  PARTICIPATION LIMITATIONS: cleaning, interpersonal relationship, driving, shopping, community activity, occupation, and yard work  PERSONAL FACTORS: Age, Behavior pattern, Past/current experiences, Sex, Time since onset of injury/illness/exacerbation, Transportation, and 1 comorbidity: migraines  are also affecting patient's functional outcome.   REHAB POTENTIAL: Fair time since onset  CLINICAL DECISION MAKING: Evolving/moderate complexity  EVALUATION COMPLEXITY: Moderate   PLAN:  PT FREQUENCY: 2x/week  PT DURATION: 4 weeks  PLANNED INTERVENTIONS: Therapeutic exercises, Therapeutic activity, Neuromuscular re-education, Balance training, Gait training, Patient/Family education, Self Care, Joint mobilization, Stair training, Vestibular training, Canalith repositioning, Visual/preceptual remediation/compensation, DME instructions, Aquatic Therapy, Manual therapy, and Re-evaluation  PLAN FOR NEXT SESSION: next visit is #5 - per appt. Notes only 5 visits authorized at this time - renew?; continue visual/vestibular exercises     Lacara Dunsworth, Donavan Burnet, PT 05/08/2023, 7:13 PM

## 2023-05-11 ENCOUNTER — Encounter: Payer: Self-pay | Admitting: Physical Therapy

## 2023-05-11 ENCOUNTER — Ambulatory Visit: Payer: Federal, State, Local not specified - PPO | Admitting: Physical Therapy

## 2023-05-11 ENCOUNTER — Other Ambulatory Visit: Payer: Self-pay | Admitting: Pulmonary Disease

## 2023-05-11 VITALS — BP 130/95 | HR 83

## 2023-05-11 DIAGNOSIS — R2681 Unsteadiness on feet: Secondary | ICD-10-CM

## 2023-05-11 DIAGNOSIS — R42 Dizziness and giddiness: Secondary | ICD-10-CM

## 2023-05-11 NOTE — Therapy (Signed)
OUTPATIENT PHYSICAL THERAPY VESTIBULAR TREATMENT     Patient Name: Norma Dunn MRN: [id number] DOB:1975/09/22, 47 y.o., female Today's Date: 05/11/2023  END OF SESSION:  PT End of Session - 05/11/23 1231     Visit Number 5    Number of Visits 9    Date for PT Re-Evaluation 05/26/23    Authorization Type BCBS    PT Start Time 1229    PT Stop Time 1315    PT Time Calculation (min) 46 min    Equipment Utilized During Treatment Gait belt    Activity Tolerance Patient tolerated treatment well    Behavior During Therapy WFL for tasks assessed/performed              Past Medical History:  Diagnosis Date   Asthma    Headache    Past Surgical History:  Procedure Laterality Date   OOPHORECTOMY  2005   right   TUBAL LIGATION     left   TUBAL LIGATION     left   Patient Active Problem List   Diagnosis Date Noted   Pelvic pain in female 04/21/2022   Mild intermittent asthma without complication 04/21/2022   Personal history of COVID-19 04/21/2022    [facility] REFERRING PROVIDER: Shon Millet, DO  REFERRING DIAG: R42 (ICD-10-CM) - Dizziness   THERAPY DIAG:  Dizziness and giddiness  Unsteadiness on feet  ONSET DATE: 04/20/2023 referral  Rationale for Evaluation and Treatment: Rehabilitation  SUBJECTIVE:   SUBJECTIVE STATEMENT: Patient reports that she has been pretty dizzy since last visit. She ranks baseline dizziness as 3.5/10. Patient reports that she has also had migraines with her dizziness. Patient reports that she has never had thyroid workup.   Pt accompanied by: family member  PERTINENT HISTORY: migraines  PAIN:  Are you having pain? No  PRECAUTIONS: Fall   PATIENT GOALS: "to get back to normal"  OBJECTIVE:   DIAGNOSTIC FINDINGS: 04/07/23 brain MRI IMPRESSION: 1. Unchanged Chiari I malformation. 2. Otherwise unremarkable appearance of the brain.  VESTIBULAR ASSESSMENT:  VESTIBULAR TREATMENT:                                                                                                     Vitals:   05/11/23 1237  BP: (!) 130/95  Pulse: 83   NeuroRe-ed:   Busy checkered background lateral stepping to color dots with 4lb med ball slams x 5 at each dot step (SBA) x 2 clockwise/counterclockwise  - reports minor increase in symptoms to 4/10  Busy checkered 4lb bounce to self then bounce pass to therapist 2 x 5 (SBA)   - reports minor increase in symptoms to 4/10  Standing on BOSU round side down in // bars with 2 x 10 various challenges note below (SBA) - no LOB noted  - shoulder rolls  - scap squeezes   - chin tucks *Reports reduction to 3/10  Standing on BOSU round side down in // bars with EC without UE support (min use as needed) 3 x 30 seconds (CGA-SBA)  Doubled up blue foam balance  beam patinet standing in tandem with bean bag pickup looking down x 2 horizontal head turns and toss to target 1 x 8 to each side leading with opposite first - SBA performed between // bars with ~2x UE use to stabilize  *Reports reduction in dizziness to 2-2.5/10  6 Blaze pods on random setting for improved gaze stabilization when looking down and up and balance reactions.  Performed on 1 minute intervals with 30 rest periods.  Pt requires SBA guarding. Round 1:  side stepping on tandem balance beam with UE taps to blaze pods on low mat setup.  10 hits. Round 2:  side stepping on tandem balance beam with UE taps to blaze pods on low mat setup.  18 hits. Round 3:  side stepping on tandem balance beam with UE taps to blaze pods on low mat setup.  19 hits. Notable errors/deficits: NA - no LOB, good balance corrections throughout  *Reports dizziness 2.5/10 at end of session  PATIENT EDUCATION: Education details: Continue HEP  Person educated: Patient and Parent Education method: Explanation Education comprehension: verbalized understanding and needs further education  HOME EXERCISE PROGRAM: Head Motion:  Side to Side    Sitting, tilt head down 15-30, slowly move head to right with eyes open. Hold position until symptoms subside. Then, move head slowly to opposite side. Hold position until symptoms subside. Repeat __10__ times per session. Do __3__ sessions per day. Head Motion: Up and Down    Sitting, slowly move head up with eyes open. Hold position until symptoms subside. Then, move head in opposite direction. Hold position until symptoms subside. Repeat __10__ times per session. Do ___3_ sessions per day. Bending / Picking Up Objects    Sitting, slowly bend head down and pick up object on the floor. Return to upright position. Hold position until symptoms subside. Repeat ___8_ times per session. Do ___3_ sessions per day. Gaze Stabilization: Sitting    Keeping eyes on target on wall 5 feet away, tilt head down 15-30 and move head side to side for _30___ seconds. Repeat while moving head up and down for __30__ seconds. Do _3___ sessions per day.  Access Code: [email or web address] URL: [email or web address] Date: 05/11/2023 Prepared by: Maryruth Eve  Exercises - Seated Scapular Retraction  - 1 x daily - 7 x weekly - 3 sets - 10 reps - Seated Cervical Retraction  - 1 x daily - 7 x weekly - 3 sets - 10 reps - Standing Backward Shoulder Rolls  - 1 x daily - 7 x weekly - 3 sets - 10 reps  GOALS: Goals reviewed with patient? Yes  SHORT TERM GOALS: = LTG based on PT POC length  LONG TERM GOALS: Target date: 05/26/23  Pt will be independent with final HEP for improved symptoms report  Baseline: to be provided Goal status: INITIAL  2.  Pt will report </= 2/5 for all movements on MSQ to indicate improvement in motion sensitivity and improved activity tolerance.   Baseline: up to 4/5 Goal status: INITIAL  3.  DVA goal  Baseline: 2 line difference Goal status: Discontinued WFL  4.  Patient will score >/= 58 on FOTO to demonstrate improved symptom report Baseline:  39 Goal status: INITIAL  5.  Patient will improve SOT goal by 5 points to demonstrate progress towards improved integration of visual, vestibular, and somatosensory balance systems in order to return to PLOF.   Baseline: 50  Goal status: INITIAL   ASSESSMENT:  CLINICAL IMPRESSION: PT session focused again  on progressing visual/vestibular exercises and habituation activities as simulated work activities with emphasis on gaze stabilization strategies with visual scanning. Pt reported dizziness 3.5 at starte of session which increased to 4/10 with busy background/med ball work but reduced to 2.5/10 by end of session. Patient demonstrates appropriate balance strategies throughout. Continue POC and pending progress consider early D/C.   OBJECTIVE IMPAIRMENTS: decreased knowledge of condition, dizziness, and pain.   ACTIVITY LIMITATIONS: carrying, lifting, standing, stairs, bed mobility, reach over head, hygiene/grooming, locomotion level, and caring for others  PARTICIPATION LIMITATIONS: cleaning, interpersonal relationship, driving, shopping, community activity, occupation, and yard work  PERSONAL FACTORS: Age, Behavior pattern, Past/current experiences, Sex, Time since onset of injury/illness/exacerbation, Transportation, and 1 comorbidity: migraines  are also affecting patient's functional outcome.   REHAB POTENTIAL: Fair time since onset  CLINICAL DECISION MAKING: Evolving/moderate complexity  EVALUATION COMPLEXITY: Moderate   PLAN:  PT FREQUENCY: 2x/week  PT DURATION: 4 weeks  PLANNED INTERVENTIONS: Therapeutic exercises, Therapeutic activity, Neuromuscular re-education, Balance training, Gait training, Patient/Family education, Self Care, Joint mobilization, Stair training, Vestibular training, Canalith repositioning, Visual/preceptual remediation/compensation, DME instructions, Aquatic Therapy, Manual therapy, and Re-evaluation  PLAN FOR NEXT SESSION: continue visual/vestibular  exercises, busy background with visual scanning and reading, recommend pickup tasks    Carmelia Bake, PT 05/11/2023, 4:43 PM

## 2023-05-15 ENCOUNTER — Encounter: Payer: Self-pay | Admitting: Physical Therapy

## 2023-05-15 ENCOUNTER — Ambulatory Visit: Payer: Federal, State, Local not specified - PPO | Admitting: Physical Therapy

## 2023-05-15 DIAGNOSIS — R2681 Unsteadiness on feet: Secondary | ICD-10-CM

## 2023-05-15 DIAGNOSIS — R42 Dizziness and giddiness: Secondary | ICD-10-CM

## 2023-05-15 NOTE — Therapy (Signed)
OUTPATIENT PHYSICAL THERAPY VESTIBULAR TREATMENT     Patient Name: Norma Dunn MRN: [id number] DOB:Jan 09, 1976, 47 y.o., female Today's Date: 05/15/2023  END OF SESSION:  PT End of Session - 05/15/23 1808     Visit Number 6    Number of Visits 9    Date for PT Re-Evaluation 05/26/23    Authorization Type BCBS    PT Start Time 1105    PT Stop Time 1150    PT Time Calculation (min) 45 min    Activity Tolerance Patient tolerated treatment well    Behavior During Therapy [facility] for tasks assessed/performed               Past Medical History:  Diagnosis Date   Asthma    Headache    Past Surgical History:  Procedure Laterality Date   OOPHORECTOMY  2005   right   TUBAL LIGATION     left   TUBAL LIGATION     left   Patient Active Problem List   Diagnosis Date Noted   Pelvic pain in female 04/21/2022   Mild intermittent asthma without complication 04/21/2022   Personal history of COVID-19 04/21/2022    [facility] REFERRING PROVIDER: Shon Millet, DO  REFERRING DIAG: R42 (ICD-10-CM) - Dizziness   THERAPY DIAG:  Dizziness and giddiness  Unsteadiness on feet  ONSET DATE: 04/20/2023 referral  Rationale for Evaluation and Treatment: Rehabilitation  SUBJECTIVE:   SUBJECTIVE STATEMENT: Patient reports she has more of a headache at start of session but took a Motrin - reports dizziness 1.5/10 intensity at this time.  Pt says she is thinking of returning to work tomorrow to see how she does.  Pt reports she is doing better - dizziness is not as intense as it was initially  Pt accompanied by: family member  PERTINENT HISTORY: migraines  PAIN:  Are you having pain? No  PRECAUTIONS: Fall   PATIENT GOALS: "to get back to normal"  OBJECTIVE:   DIAGNOSTIC FINDINGS: 04/07/23 brain MRI IMPRESSION: 1. Unchanged Chiari I malformation. 2. Otherwise unremarkable appearance of the brain.   VESTIBULAR TREATMENT:                                                                                                     NeuroRe-ed:  Pt stood on inverted Bosu inside // bars to have UE support prn;  performed standing with EC 10 secs; added horizontal head turns 5 reps EO and vertical head turns EO 5 reps; progressed to standing with EC 10 secs, then head turns horizontally and vertically 5 reps with EC  Squats with EO on inverted Bosu 3 reps;  squats with EC 5 reps with minimal UE support on // bars  Simulated work activities/tasks - pt stood on foam - reached for bean bag on Rt and Lt sides - on computer table on Rt side and in various locations from PT's hand on Lt side for increased eye and head movement with visual scanning; performed 3 reps total with attempt to increase speed with each rep  Pt stood on  checkered cloth on blue mat on floor for compliant surface training and for increased visual input - performed habituation activity on picking up bean bags (5) - individually- from one of mat, turning 180 degrees and tossing into basket;  performed 2 reps of turning 360 degrees on cloth/mat  - 1 rep to Rt side and 1 rep to Lt side for habituation with turning  Pt performed ambulation with horizontal head turns 35' x 1 rep - slowed gait with discontinuous steps noted   Stood on patterned cloth - read various letters from 2 colored [place] charts on Rt/Lt side for increased head turn with visual scanning and for improved gaze stabilization   Simulated work activity for filing mail - pt stood on Airex facing balance corner area - used 2 [employer], 1 on Rt wall and 1 on Lt wall and 2 letters "A " and "X" target on middle wall - pt read letters on [employer] on Lt, "A" and "X" and letters on [place] chart on Rt side - letters in various sequences for improved visual scanning - dizziness reported at 3/10 intensity after activity  *Reports dizziness 3/10 at end of session; pt reports she changed her mind about trying to go to work tomorrow - is going  to try to have 2 days without dizziness before she tries to return to work  PATIENT EDUCATION: Education details: Continue HEP  Person educated: Patient and Parent Education method: Explanation Education comprehension: verbalized understanding and needs further education  HOME EXERCISE PROGRAM: Head Motion: Side to Side    Sitting, tilt head down 15-30, slowly move head to right with eyes open. Hold position until symptoms subside. Then, move head slowly to opposite side. Hold position until symptoms subside. Repeat __10__ times per session. Do __3__ sessions per day. Head Motion: Up and Down    Sitting, slowly move head up with eyes open. Hold position until symptoms subside. Then, move head in opposite direction. Hold position until symptoms subside. Repeat __10__ times per session. Do ___3_ sessions per day. Bending / Picking Up Objects    Sitting, slowly bend head down and pick up object on the floor. Return to upright position. Hold position until symptoms subside. Repeat ___8_ times per session. Do ___3_ sessions per day. Gaze Stabilization: Sitting    Keeping eyes on target on wall 5 feet away, tilt head down 15-30 and move head side to side for _30___ seconds. Repeat while moving head up and down for __30__ seconds. Do _3___ sessions per day.  Access Code: [email or web address] URL: [email or web address] Date: 05/11/2023 Prepared by: Maryruth Eve  Exercises - Seated Scapular Retraction  - 1 x daily - 7 x weekly - 3 sets - 10 reps - Seated Cervical Retraction  - 1 x daily - 7 x weekly - 3 sets - 10 reps - Standing Backward Shoulder Rolls  - 1 x daily - 7 x weekly - 3 sets - 10 reps  GOALS: Goals reviewed with patient? Yes  SHORT TERM GOALS: = LTG based on PT POC length  LONG TERM GOALS: Target date: 05/26/23  Pt will be independent with final HEP for improved symptoms report  Baseline: to be provided Goal status: INITIAL  2.  Pt will report </= 2/5 for  all movements on MSQ to indicate improvement in motion sensitivity and improved activity tolerance.   Baseline: up to 4/5 Goal status: INITIAL  3.  DVA goal  Baseline: 2 line difference Goal status: Discontinued WFL  4.  Patient will score >/= 58 on FOTO to demonstrate improved symptom report Baseline: 39 Goal status: INITIAL  5.  Patient will improve SOT goal by 5 points to demonstrate progress towards improved integration of visual, vestibular, and somatosensory balance systems in order to return to PLOF.   Baseline: 50  Goal status: INITIAL   ASSESSMENT:  CLINICAL IMPRESSION: PT session focused on visual/vestibular exercises and work simulated activities to improve gaze stabilization with head turns with reaching incorporated in tasks.  Pt able to perform gaze stabilization exercise with use of 2 colored hart charts on either side for increased head movement with standing on Airex in today's session, whereas, pt previously performed this activity with standing on floor.  Pt rated dizziness 1.5/10 at start of session and 3/10 at end of session.  Pt is progressing well. Cont with POC.   OBJECTIVE IMPAIRMENTS: decreased knowledge of condition, dizziness, and pain.   ACTIVITY LIMITATIONS: carrying, lifting, standing, stairs, bed mobility, reach over head, hygiene/grooming, locomotion level, and caring for others  PARTICIPATION LIMITATIONS: cleaning, interpersonal relationship, driving, shopping, community activity, occupation, and yard work  PERSONAL FACTORS: Age, Behavior pattern, Past/current experiences, Sex, Time since onset of injury/illness/exacerbation, Transportation, and 1 comorbidity: migraines  are also affecting patient's functional outcome.   REHAB POTENTIAL: Fair time since onset  CLINICAL DECISION MAKING: Evolving/moderate complexity  EVALUATION COMPLEXITY: Moderate   PLAN:  PT FREQUENCY: 2x/week  PT DURATION: 4 weeks  PLANNED INTERVENTIONS: Therapeutic  exercises, Therapeutic activity, Neuromuscular re-education, Balance training, Gait training, Patient/Family education, Self Care, Joint mobilization, Stair training, Vestibular training, Canalith repositioning, Visual/preceptual remediation/compensation, DME instructions, Aquatic Therapy, Manual therapy, and Re-evaluation  PLAN FOR NEXT SESSION: continue visual/vestibular exercises, busy background with visual scanning and reading, recommend pickup tasks    Renn Dirocco, Donavan Burnet, PT 05/15/2023, 6:10 PM

## 2023-05-19 ENCOUNTER — Ambulatory Visit: Payer: Federal, State, Local not specified - PPO

## 2023-05-19 DIAGNOSIS — R2681 Unsteadiness on feet: Secondary | ICD-10-CM | POA: Diagnosis not present

## 2023-05-19 DIAGNOSIS — R42 Dizziness and giddiness: Secondary | ICD-10-CM

## 2023-05-19 NOTE — Therapy (Signed)
OUTPATIENT PHYSICAL THERAPY VESTIBULAR TREATMENT     Patient Name: Norma Dunn MRN: [id number] DOB:09-Jul-1976, 47 y.o., female Today's Date: 05/19/2023  END OF SESSION:  PT End of Session - 05/19/23 1007     Visit Number 7    Number of Visits 9    Date for PT Re-Evaluation 05/26/23    Authorization Type BCBS    PT Start Time 1012    PT Stop Time 1100    PT Time Calculation (min) 48 min    Activity Tolerance Patient tolerated treatment well    Behavior During Therapy [facility] for tasks assessed/performed               Past Medical History:  Diagnosis Date   Asthma    Headache    Past Surgical History:  Procedure Laterality Date   OOPHORECTOMY  2005   right   TUBAL LIGATION     left   TUBAL LIGATION     left   Patient Active Problem List   Diagnosis Date Noted   Pelvic pain in female 04/21/2022   Mild intermittent asthma without complication 04/21/2022   Personal history of COVID-19 04/21/2022    [facility] REFERRING PROVIDER: Shon Millet, DO  REFERRING DIAG: R42 (ICD-10-CM) - Dizziness   THERAPY DIAG:  Dizziness and giddiness  Unsteadiness on feet  ONSET DATE: 04/20/2023 referral  Rationale for Evaluation and Treatment: Rehabilitation  SUBJECTIVE:   SUBJECTIVE STATEMENT Patient reports doing fair. Does have a slight HA today- took Motrin. Dizziness is slightly better. FMLA was approved through Nov, per patient. Denies falls. Per patient, needs to be able to pick things up off the floor while keeping 1 leg in the air because she's how the [employer] wants her to do it.   Pt accompanied by: family member  PERTINENT HISTORY: migraines  PAIN:  Are you having pain? No  PRECAUTIONS: Fall   PATIENT GOALS: "to get back to normal"  OBJECTIVE:   DIAGNOSTIC FINDINGS: 04/07/23 brain MRI IMPRESSION: 1. Unchanged Chiari I malformation. 2. Otherwise unremarkable appearance of the brain.   VESTIBULAR TREATMENT:                                                                                                     NeuroRe-ed:  -standing corner trunk twist to reach behind herself, tap ball to [employer] on pattern background -> ball bounce on pattern rug   -simulated reaching into mail bin to retrieve cones with up/down motion   -reports no dizziness with this   -progressed to standing on foam (no difficulty or dizziness)  -standing sideways on ramp, NBOS VOR [place] Chart   -horizontal< vertical   - standing on rockerboard A/P simulating mail sorting with horizontal head turns + scanning   -no LOB and no reports of dizziness  PATIENT EDUCATION: Education details: Continue HEP, benefit of hearing protection at work to minimize exacerbations of migraine symptoms Person educated: Patient and Parent Education method: Explanation Education comprehension: verbalized understanding and needs further education  HOME EXERCISE PROGRAM: Head Motion: Side to  Side    Sitting, tilt head down 15-30, slowly move head to right with eyes open. Hold position until symptoms subside. Then, move head slowly to opposite side. Hold position until symptoms subside. Repeat __10__ times per session. Do __3__ sessions per day. Head Motion: Up and Down    Sitting, slowly move head up with eyes open. Hold position until symptoms subside. Then, move head in opposite direction. Hold position until symptoms subside. Repeat __10__ times per session. Do ___3_ sessions per day. Bending / Picking Up Objects    Sitting, slowly bend head down and pick up object on the floor. Return to upright position. Hold position until symptoms subside. Repeat ___8_ times per session. Do ___3_ sessions per day. Gaze Stabilization: Sitting    Keeping eyes on target on wall 5 feet away, tilt head down 15-30 and move head side to side for _30___ seconds. Repeat while moving head up and down for __30__ seconds. Do _3___ sessions per day.  Access Code:  [email or web address] URL: [email or web address] Date: 05/11/2023 Prepared by: Maryruth Eve  Exercises - Seated Scapular Retraction  - 1 x daily - 7 x weekly - 3 sets - 10 reps - Seated Cervical Retraction  - 1 x daily - 7 x weekly - 3 sets - 10 reps - Standing Backward Shoulder Rolls  - 1 x daily - 7 x weekly - 3 sets - 10 reps  GOALS: Goals reviewed with patient? Yes  SHORT TERM GOALS: = LTG based on PT POC length  LONG TERM GOALS: Target date: 05/26/23  Pt will be independent with final HEP for improved symptoms report  Baseline: to be provided Goal status: INITIAL  2.  Pt will report </= 2/5 for all movements on MSQ to indicate improvement in motion sensitivity and improved activity tolerance.   Baseline: up to 4/5 Goal status: INITIAL  3.  DVA goal  Baseline: 2 line difference Goal status: Discontinued WFL  4.  Patient will score >/= 58 on FOTO to demonstrate improved symptom report Baseline: 39 Goal status: INITIAL  5.  Patient will improve SOT goal by 5 points to demonstrate progress towards improved integration of visual, vestibular, and somatosensory balance systems in order to return to PLOF.   Baseline: 50  Goal status: INITIAL   ASSESSMENT:  CLINICAL IMPRESSION: Patient seen for skilled PT session with emphasis on vestibular retraining. Tolerated progression in work-related tasks with increased vestibular challenge well with little to no increase in symptoms. Does reports greater exacerbation of symptoms with vertical vs horizontal tasks. Discussed ways to reduce onset/worsening of migraine symptoms at work including hearing protection. Continue POC.   OBJECTIVE IMPAIRMENTS: decreased knowledge of condition, dizziness, and pain.   ACTIVITY LIMITATIONS: carrying, lifting, standing, stairs, bed mobility, reach over head, hygiene/grooming, locomotion level, and caring for others  PARTICIPATION LIMITATIONS: cleaning, interpersonal relationship, driving,  shopping, community activity, occupation, and yard work  PERSONAL FACTORS: Age, Behavior pattern, Past/current experiences, Sex, Time since onset of injury/illness/exacerbation, Transportation, and 1 comorbidity: migraines  are also affecting patient's functional outcome.   REHAB POTENTIAL: Fair time since onset  CLINICAL DECISION MAKING: Evolving/moderate complexity  EVALUATION COMPLEXITY: Moderate   PLAN:  PT FREQUENCY: 2x/week  PT DURATION: 4 weeks  PLANNED INTERVENTIONS: Therapeutic exercises, Therapeutic activity, Neuromuscular re-education, Balance training, Gait training, Patient/Family education, Self Care, Joint mobilization, Stair training, Vestibular training, Canalith repositioning, Visual/preceptual remediation/compensation, DME instructions, Aquatic Therapy, Manual therapy, and Re-evaluation  PLAN FOR NEXT SESSION: continue visual/vestibular exercises, busy background with visual scanning  and reading, recommend pickup tasks    Westley Foots, PT Westley Foots, PT, DPT, CBIS  05/19/2023, 11:22 AM

## 2023-05-22 ENCOUNTER — Encounter: Payer: Self-pay | Admitting: Physical Therapy

## 2023-05-22 ENCOUNTER — Ambulatory Visit: Payer: Federal, State, Local not specified - PPO | Admitting: Physical Therapy

## 2023-05-22 VITALS — BP 129/94 | HR 79

## 2023-05-22 DIAGNOSIS — R42 Dizziness and giddiness: Secondary | ICD-10-CM

## 2023-05-22 DIAGNOSIS — R2681 Unsteadiness on feet: Secondary | ICD-10-CM | POA: Diagnosis not present

## 2023-05-22 NOTE — Therapy (Signed)
OUTPATIENT PHYSICAL THERAPY VESTIBULAR TREATMENT     Patient Name: Norma Dunn MRN: [id number] DOB:12/27/1975, 47 y.o., female Today's Date: 05/22/2023  END OF SESSION:  PT End of Session - 05/22/23 1059     Visit Number 8    Number of Visits 9    Date for PT Re-Evaluation 05/26/23    Authorization Type BCBS    PT Start Time 1100    PT Stop Time 1145    PT Time Calculation (min) 45 min    Equipment Utilized During Treatment Gait belt    Activity Tolerance Patient tolerated treatment well    Behavior During Therapy WFL for tasks assessed/performed               Past Medical History:  Diagnosis Date   Asthma    Headache    Past Surgical History:  Procedure Laterality Date   OOPHORECTOMY  2005   right   TUBAL LIGATION     left   TUBAL LIGATION     left   Patient Active Problem List   Diagnosis Date Noted   Pelvic pain in female 04/21/2022   Mild intermittent asthma without complication 04/21/2022   Personal history of COVID-19 04/21/2022    [facility] REFERRING PROVIDER: Shon Millet, DO  REFERRING DIAG: R42 (ICD-10-CM) - Dizziness   THERAPY DIAG:  Dizziness and giddiness  Unsteadiness on feet  ONSET DATE: 04/20/2023 referral  Rationale for Evaluation and Treatment: Rehabilitation  SUBJECTIVE:   SUBJECTIVE STATEMENT Patient reports that she is feeling alright. She reports that her dizziness is starting to wear off more; she is having more challenges with headaches. Denies falls/near falls.   Pt accompanied by: family member  PERTINENT HISTORY: migraines  PAIN:  Are you having pain? Yes: NPRS scale: 5/10 Pain location: headache Pain description: achy Aggravating factors: doing to much Relieving factors: medication, rest  PRECAUTIONS: Fall   PATIENT GOALS: "to get back to normal"  OBJECTIVE:   DIAGNOSTIC FINDINGS: 04/07/23 brain MRI IMPRESSION: 1. Unchanged Chiari I malformation. 2. Otherwise unremarkable  appearance of the brain.  VESTIBULAR TREATMENT:                                                                                                    Vitals:   05/22/23 1105 05/22/23 1108  BP: (!) 129/98 (!) 129/94  Pulse: 81 79  BP initially elevated did EC with deep breathing x 5 repetitions; BP lowered to safe therapeutic range  NeuroRe-ed:   Training and development officer Test  Component  Passed  Falls  1 = Eyes Open 3/3 0  2 = Eyes Closed 3/3 0  3 = Eyes Open Background Moving 3/3 0  4 = Eyes Closed Floor Moving 0/3 0  5 = Eyes Closed Floor Moving 0/3 0  6 = Eyes Open Floor and Background Moving 0/3 0   Visual At Age Matched Norms  Vestibular Below Age Matched Norms  Somatosensory Below Age Matched Norms  Composite (TOTAL SCORE: 100) Below Age Matched Norms  Reports greatest limitation was headache while completing exercises  Dynamic Visual Acuity: Static: Line 10 Dynamic: Line 8 Line Difference: 2 line difference  Between // bars: EC on rocker board with anterior/posterior rocks 3 x 30 seconds EC on rocker board with vertical head nods 3 x 10  Standing on rocker board with card return placement for speed (increased on second rep) SBA 2 x 30 cards   PATIENT EDUCATION: Education details: Continue HEP + Progress on HEP Person educated: Patient and Parent Education method: Explanation Education comprehension: verbalized understanding and needs further education  HOME EXERCISE PROGRAM: Head Motion: Side to Side    Sitting, tilt head down 15-30, slowly move head to right with eyes open. Hold position until symptoms subside. Then, move head slowly to opposite side. Hold position until symptoms subside. Repeat __10__ times per session. Do __3__ sessions per day. Head Motion: Up and Down    Sitting, slowly move head up with eyes open. Hold position until symptoms subside. Then, move head in opposite direction. Hold position until symptoms subside. Repeat __10__ times per  session. Do ___3_ sessions per day. Bending / Picking Up Objects    Sitting, slowly bend head down and pick up object on the floor. Return to upright position. Hold position until symptoms subside. Repeat ___8_ times per session. Do ___3_ sessions per day. Gaze Stabilization: Sitting    Keeping eyes on target on wall 5 feet away, tilt head down 15-30 and move head side to side for _30___ seconds. Repeat while moving head up and down for __30__ seconds. Do _3___ sessions per day.  Access Code: [email or web address] URL: [email or web address] Date: 05/11/2023 Prepared by: Maryruth Eve  Exercises - Seated Scapular Retraction  - 1 x daily - 7 x weekly - 3 sets - 10 reps - Seated Cervical Retraction  - 1 x daily - 7 x weekly - 3 sets - 10 reps - Standing Backward Shoulder Rolls  - 1 x daily - 7 x weekly - 3 sets - 10 reps  GOALS: Goals reviewed with patient? Yes  SHORT TERM GOALS: = LTG based on PT POC length  LONG TERM GOALS: Target date: 05/26/23  Pt will be independent with final HEP for improved symptoms report  Baseline: to be provided Goal status: INITIAL  2.  Pt will report </= 2/5 for all movements on MSQ to indicate improvement in motion sensitivity and improved activity tolerance.   Baseline: up to 4/5 Goal status: INITIAL  3.  DVA goal  Baseline: 2 line difference Goal status: Discontinued WFL  4.  Patient will score >/= 58 on FOTO to demonstrate improved symptom report Baseline: 39 Goal status: INITIAL  5.  Patient will improve SOT goal by 5 points to demonstrate progress towards improved integration of visual, vestibular, and somatosensory balance systems in order to return to PLOF.   Baseline: 50; improved to 56 Goal status: MET   ASSESSMENT:  CLINICAL IMPRESSION: Session emphasized assessment of SOT and progression of tasks on SOT that present with ongoing difficulty. Patient passed conditions 1-3 but continues to demonstrate below age match norms on  conditions 4-6. Patient overall score notably improved by 6 points from eval and reports migraine ongoing greatest deficit at this time. Dizziness at end of session 0/10. Continue POC.   OBJECTIVE IMPAIRMENTS: decreased knowledge of condition, dizziness, and pain.   ACTIVITY LIMITATIONS: carrying, lifting, standing, stairs, bed mobility, reach over head, hygiene/grooming, locomotion level, and caring for others  PARTICIPATION LIMITATIONS: cleaning, interpersonal relationship, driving, shopping, community activity, occupation, and yard work  PERSONAL  FACTORS: Age, Behavior pattern, Past/current experiences, Sex, Time since onset of injury/illness/exacerbation, Transportation, and 1 comorbidity: migraines  are also affecting patient's functional outcome.   REHAB POTENTIAL: Fair time since onset  CLINICAL DECISION MAKING: Evolving/moderate complexity  EVALUATION COMPLEXITY: Moderate   PLAN:  PT FREQUENCY: 2x/week  PT DURATION: 4 weeks  PLANNED INTERVENTIONS: Therapeutic exercises, Therapeutic activity, Neuromuscular re-education, Balance training, Gait training, Patient/Family education, Self Care, Joint mobilization, Stair training, Vestibular training, Canalith repositioning, Visual/preceptual remediation/compensation, DME instructions, Aquatic Therapy, Manual therapy, and Re-evaluation  PLAN FOR NEXT SESSION: review goals + plan for D/C ? (Remaining symptoms primarily migraines and dizziness largely reduced)   Carmelia Bake, PT, DPT  05/22/2023, 11:54 AM

## 2023-05-24 ENCOUNTER — Ambulatory Visit: Payer: Federal, State, Local not specified - PPO | Attending: Neurology

## 2023-05-24 DIAGNOSIS — R2681 Unsteadiness on feet: Secondary | ICD-10-CM | POA: Insufficient documentation

## 2023-05-24 DIAGNOSIS — R42 Dizziness and giddiness: Secondary | ICD-10-CM | POA: Insufficient documentation

## 2023-05-24 NOTE — Therapy (Signed)
OUTPATIENT PHYSICAL THERAPY VESTIBULAR TREATMENT- DISCHARGE SUMMARY     Patient Name: Norma Dunn MRN: [id number] DOB:06-14-1976, 47 y.o., female Today's Date: 05/24/2023   PHYSICAL THERAPY DISCHARGE SUMMARY  Visits from Start of Care: 9  Current functional level related to goals / functional outcomes: See below   Remaining deficits: See below   Education / Equipment: PT POC, HEP, work modifications   Patient agrees to discharge. Patient goals were met. Patient is being discharged due to meeting the stated rehab goals.  END OF SESSION:  PT End of Session - 05/24/23 1442     Visit Number 9    Number of Visits 9    Date for PT Re-Evaluation 05/26/23    Authorization Type BCBS    PT Start Time 1440    PT Stop Time 1505    PT Time Calculation (min) 25 min    Activity Tolerance Patient tolerated treatment well    Behavior During Therapy WFL for tasks assessed/performed               Past Medical History:  Diagnosis Date   Asthma    Headache    Past Surgical History:  Procedure Laterality Date   OOPHORECTOMY  2005   right   TUBAL LIGATION     left   TUBAL LIGATION     left   Patient Active Problem List   Diagnosis Date Noted   Pelvic pain in female 04/21/2022   Mild intermittent asthma without complication 04/21/2022   Personal history of COVID-19 04/21/2022    [facility] REFERRING PROVIDER: Shon Millet, DO  REFERRING DIAG: R42 (ICD-10-CM) - Dizziness   THERAPY DIAG:  Dizziness and giddiness  Unsteadiness on feet  ONSET DATE: 04/20/2023 referral  Rationale for Evaluation and Treatment: Rehabilitation  SUBJECTIVE:   SUBJECTIVE STATEMENT Patient reports doing well. Denies HA or current dizziness. States she did have a HA yesterday, but has resolved. Denies falls. Agreeable to Dc.   Pt accompanied by: family member  PERTINENT HISTORY: migraines  PAIN:  Are you having pain? No  PRECAUTIONS: Fall   PATIENT  GOALS: "to get back to normal"  OBJECTIVE:   DIAGNOSTIC FINDINGS: 04/07/23 brain MRI IMPRESSION: 1. Unchanged Chiari I malformation. 2. Otherwise unremarkable appearance of the brain.  VESTIBULAR TREATMENT:                                                                                                   Motion Sensitivity Quotient  Intensity: 0 = none, 1 = Lightheaded, 2 = Mild, 3 = Moderate, 4 = Severe, 5 = Vomiting  Intensity  1. Sitting to supine 0  2. Supine to L side 0  3. Supine to R side 0  4. Supine to sitting 0  5. L Hallpike-Dix   6. Up from L    7. R Hallpike-Dix   8. Up from R    9. Sitting, head  tipped to L knee 0  10. Head up from L  knee 0  11. Sitting, head  tipped to R knee 0  12.  Head up from R  knee 0  13. Sitting head turns x5 1  14.Sitting head nods x5 1  15. In stance, 180  turn to L  0  16. In stance, 180  turn to R 0  FOTO: 64   PATIENT EDUCATION: Education details: Continue HEP, exam findings, PT POC Person educated: Patient and Parent Education method: Explanation Education comprehension: verbalized understanding and needs further education  HOME EXERCISE PROGRAM: Head Motion: Side to Side    Sitting, tilt head down 15-30, slowly move head to right with eyes open. Hold position until symptoms subside. Then, move head slowly to opposite side. Hold position until symptoms subside. Repeat __10__ times per session. Do __3__ sessions per day. Head Motion: Up and Down    Sitting, slowly move head up with eyes open. Hold position until symptoms subside. Then, move head in opposite direction. Hold position until symptoms subside. Repeat __10__ times per session. Do ___3_ sessions per day. Bending / Picking Up Objects    Sitting, slowly bend head down and pick up object on the floor. Return to upright position. Hold position until symptoms subside. Repeat ___8_ times per session. Do ___3_ sessions per day. Gaze Stabilization:  Sitting    Keeping eyes on target on wall 5 feet away, tilt head down 15-30 and move head side to side for _30___ seconds. Repeat while moving head up and down for __30__ seconds. Do _3___ sessions per day.  Access Code: [email or web address] URL: [email or web address] Date: 05/11/2023 Prepared by: Maryruth Eve  Exercises - Seated Scapular Retraction  - 1 x daily - 7 x weekly - 3 sets - 10 reps - Seated Cervical Retraction  - 1 x daily - 7 x weekly - 3 sets - 10 reps - Standing Backward Shoulder Rolls  - 1 x daily - 7 x weekly - 3 sets - 10 reps  GOALS: Goals reviewed with patient? Yes  SHORT TERM GOALS: = LTG based on PT POC length  LONG TERM GOALS: Target date: 05/26/23  Pt will be independent with final HEP for improved symptoms report  Baseline: to be provided; provided Goal status: MET  2.  Pt will report </= 2/5 for all movements on MSQ to indicate improvement in motion sensitivity and improved activity tolerance.   Baseline: up to 4/5; 1/5 Goal status: MET  3.  DVA goal  Baseline: 2 line difference Goal status: Discontinued WFL  4.  Patient will score >/= 58 on FOTO to demonstrate improved symptom report Baseline: 39; 64 Goal status: MET  5.  Patient will improve SOT goal by 5 points to demonstrate progress towards improved integration of visual, vestibular, and somatosensory balance systems in order to return to PLOF.   Baseline: 50; improved to 56 Goal status: MET   ASSESSMENT:  CLINICAL IMPRESSION: Patient seen for skilled PT session with emphasis on checking goals and dc from PT. Patient met 4/4 LTG. HA has improved significantly. Discussed return to work modifications and patient agreeable to attempting to go to work Friday or Saturday. Patient to dc from PT at this time.   OBJECTIVE IMPAIRMENTS: decreased knowledge of condition, dizziness, and pain.   ACTIVITY LIMITATIONS: carrying, lifting, standing, stairs, bed mobility, reach over head,  hygiene/grooming, locomotion level, and caring for others  PARTICIPATION LIMITATIONS: cleaning, interpersonal relationship, driving, shopping, community activity, occupation, and yard work  PERSONAL FACTORS: Age, Behavior pattern, Past/current experiences, Sex, Time since onset of injury/illness/exacerbation, Transportation, and 1 comorbidity: migraines  are also affecting patient's functional  outcome.   REHAB POTENTIAL: Fair time since onset  CLINICAL DECISION MAKING: Evolving/moderate complexity  EVALUATION COMPLEXITY: Moderate   PLAN:  PT FREQUENCY: 2x/week  PT DURATION: 4 weeks  PLANNED INTERVENTIONS: Therapeutic exercises, Therapeutic activity, Neuromuscular re-education, Balance training, Gait training, Patient/Family education, Self Care, Joint mobilization, Stair training, Vestibular training, Canalith repositioning, Visual/preceptual remediation/compensation, DME instructions, Aquatic Therapy, Manual therapy, and Re-evaluation  PLAN FOR NEXT SESSION: dc from PT   Westley Foots, PT Westley Foots, PT, DPT, CBIS   05/24/2023, 3:18 PM

## 2023-06-17 IMAGING — MG MM DIGITAL SCREENING BILAT W/ TOMO AND CAD
8 series · 8 of 24 positions shown · non-contrast
Comparison: Previous exam(s).

CLINICAL DATA: Screening.

EXAM:
DIGITAL SCREENING BILATERAL MAMMOGRAM WITH TOMOSYNTHESIS AND CAD
TECHNIQUE: Bilateral screening digital craniocaudal and mediolateral oblique
mammograms were obtained. Bilateral screening digital breast
tomosynthesis was performed. The images were evaluated with
computer-aided detection.

[R CC synth-2D]
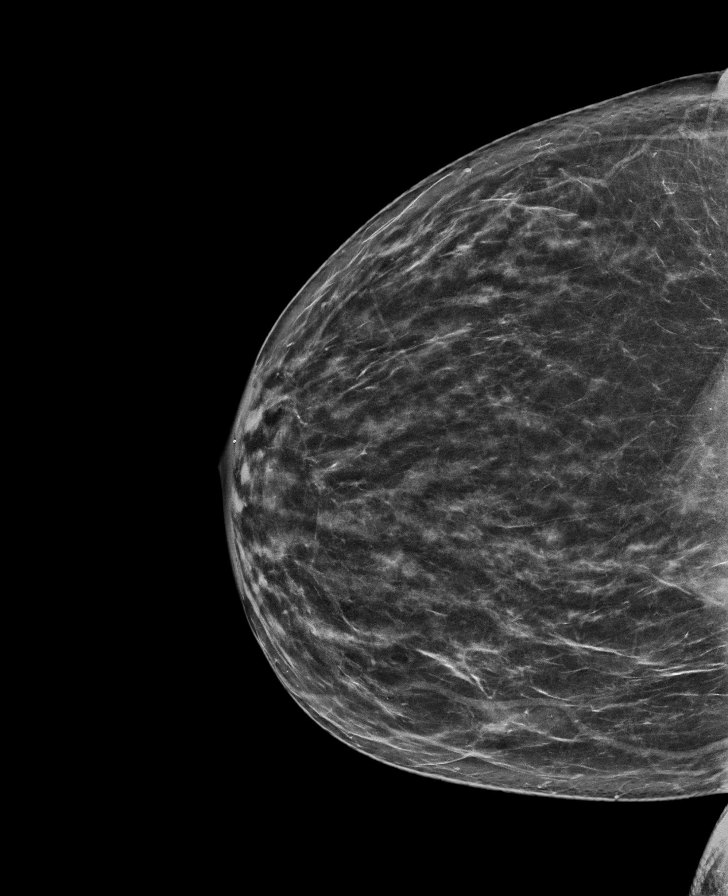

[L CC synth-2D]
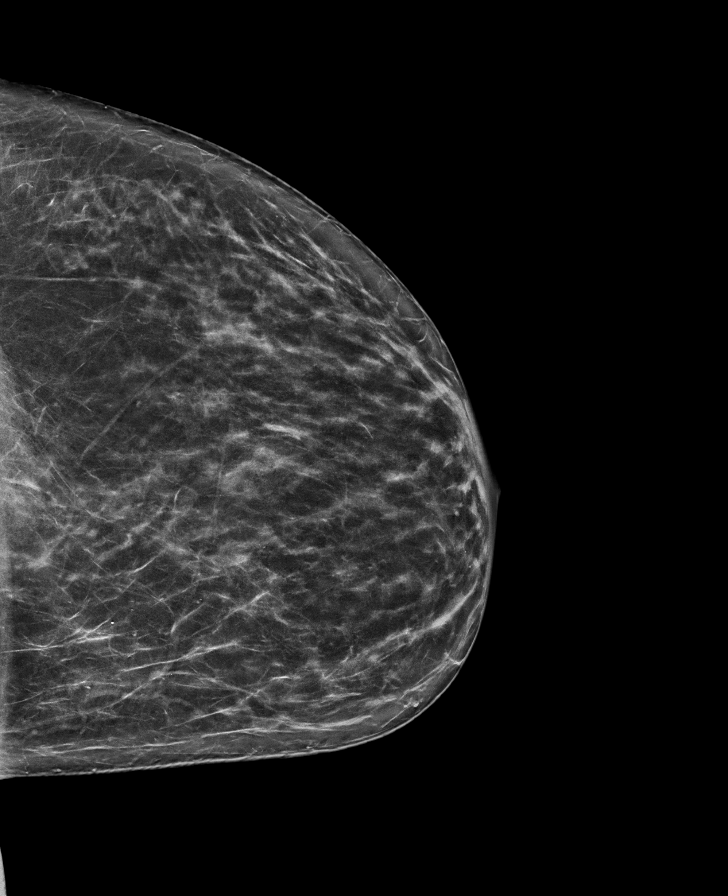

[R MLO synth-2D]
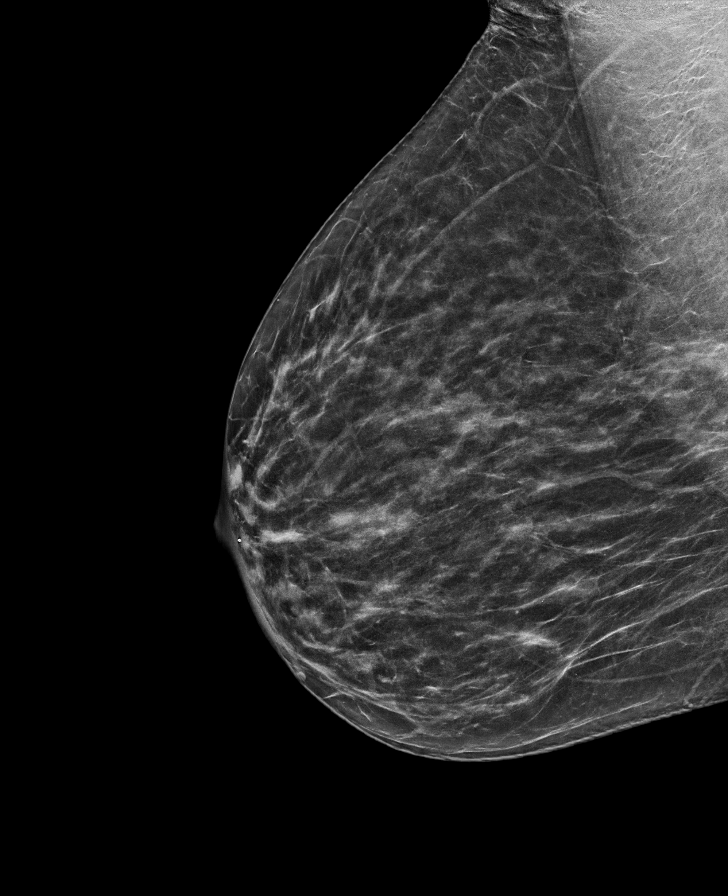

[L MLO synth-2D]
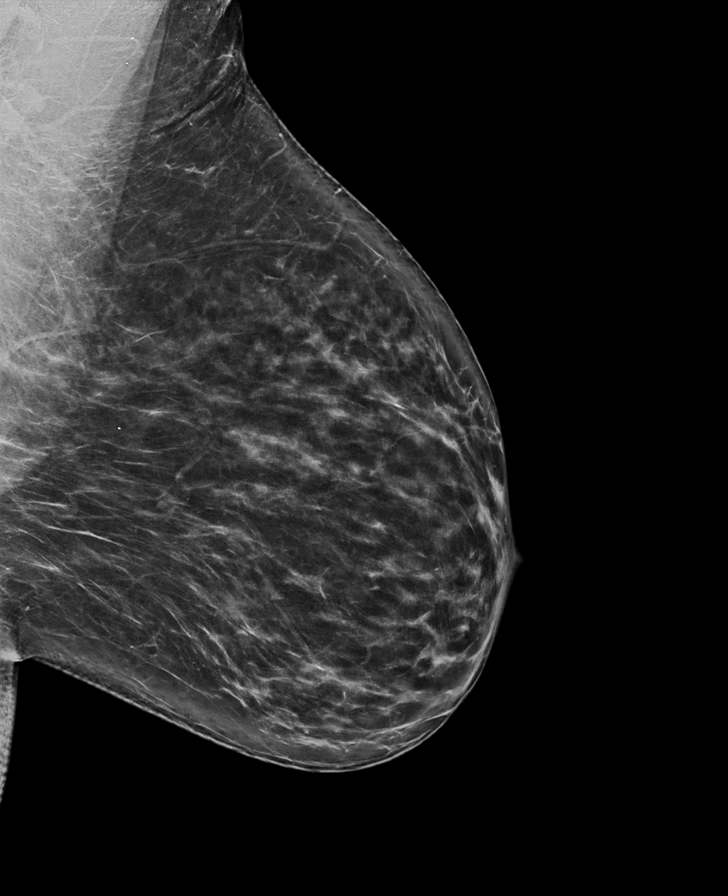

[R MLO tomo · tomo slice 35/70.0]
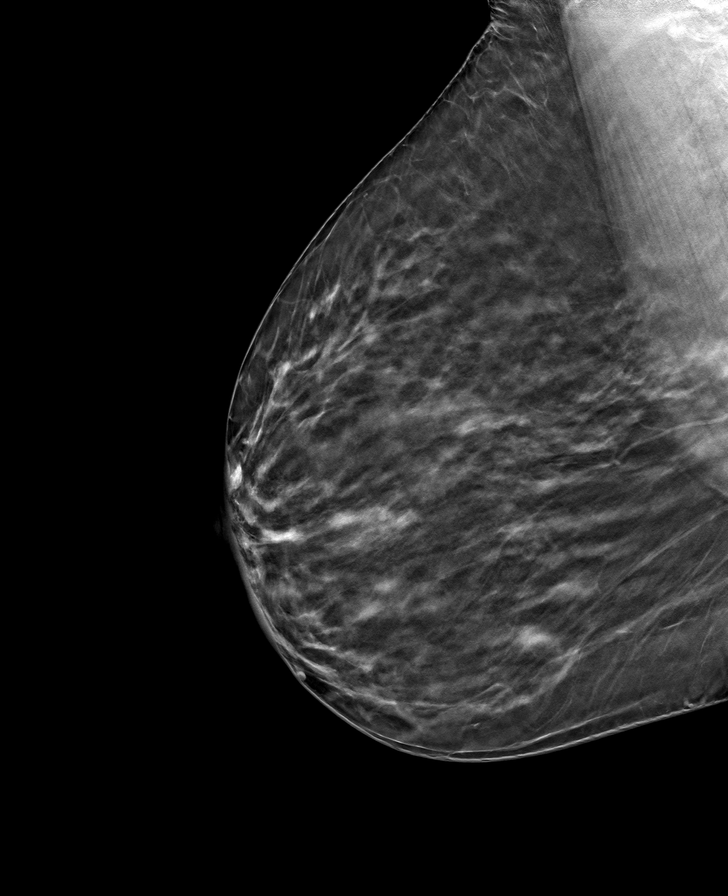

[R CC tomo · tomo slice 37/74.0]
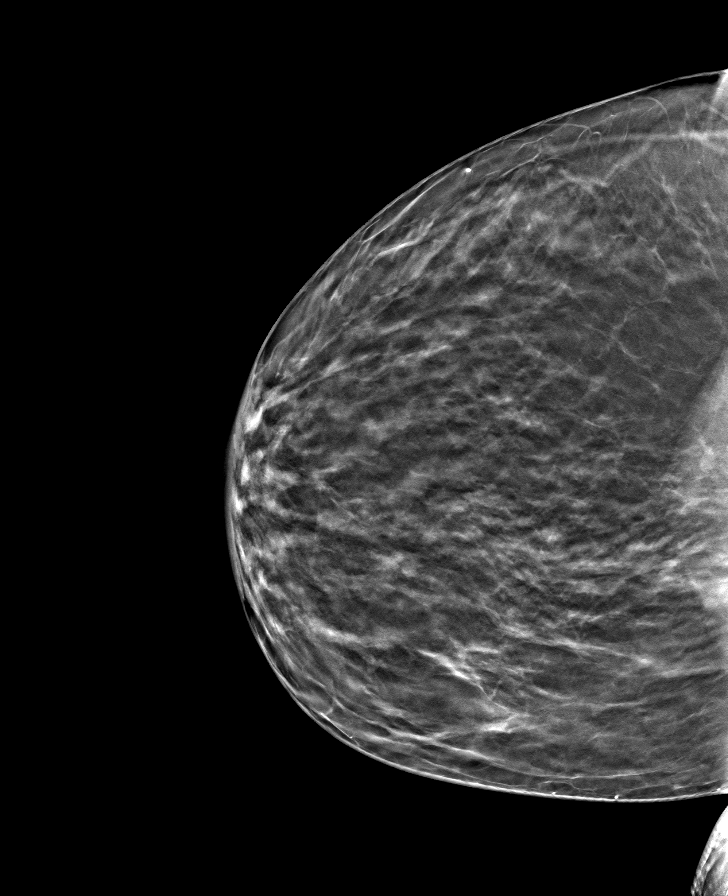

[L MLO tomo · tomo slice 43/86.0]
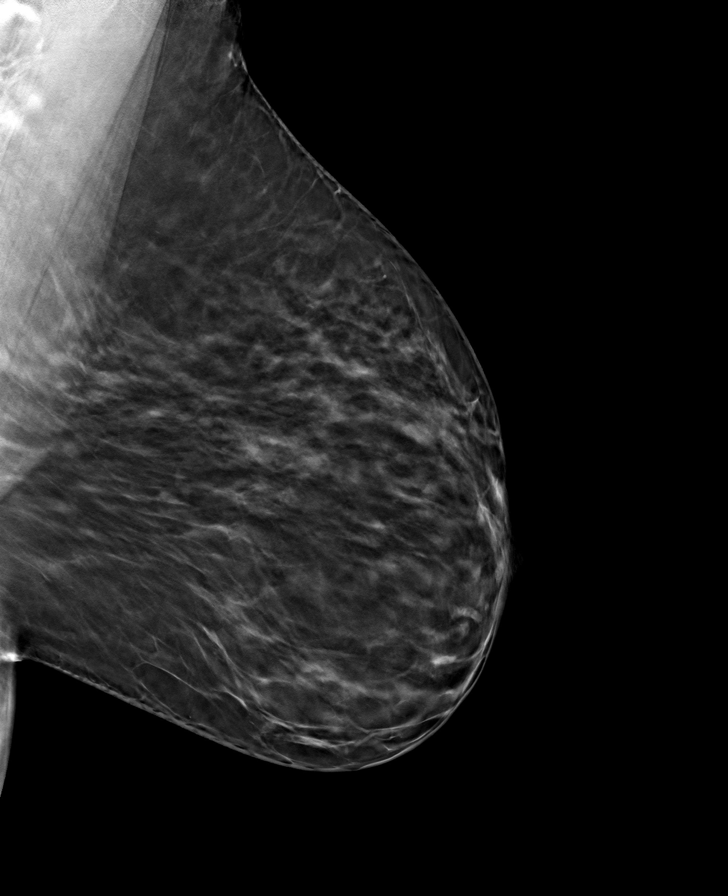

[L CC tomo · tomo slice 37/74.0]
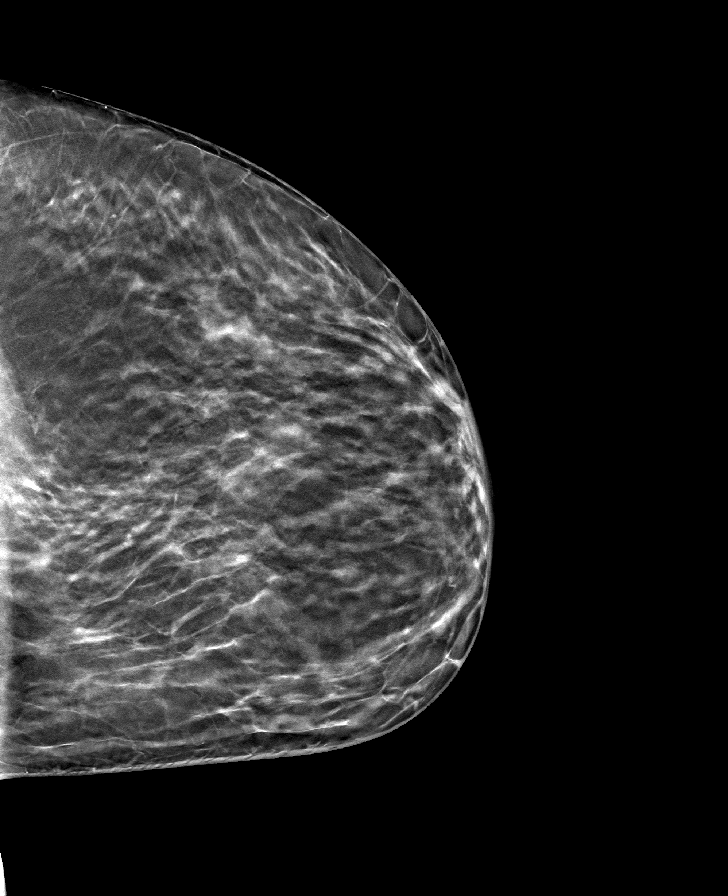

[8 of 24 positions shown; findings below may reference images not displayed]

ACR Breast Density Category b: There are scattered areas of
fibroglandular density.
FINDINGS: There are no findings suspicious for malignancy.
IMPRESSION: No mammographic evidence of malignancy. A result letter of this
screening mammogram will be mailed directly to the patient.

RECOMMENDATION:
Screening mammogram in one year. (Code:51-O-LD2)

BI-RADS CATEGORY  1: Negative.

## 2023-07-10 ENCOUNTER — Encounter: Payer: Self-pay | Admitting: Neurology

## 2023-07-11 ENCOUNTER — Telehealth: Payer: Self-pay

## 2023-07-11 NOTE — Telephone Encounter (Signed)
Medication Samples have been provided to the patient.  Drug name: Emgality       Strength: 120 mg        Qty: 1  LOT: [id number]  Exp.Date: 02/27/25  Dosing instructions: every 28 days  The patient has been instructed regarding the correct time, dose, and frequency of taking this medication, including desired effects and most common side effects.   Leida Lauth 4:13 PM 07/11/2023

## 2023-08-02 ENCOUNTER — Other Ambulatory Visit: Payer: Self-pay | Admitting: Family Medicine

## 2023-08-02 DIAGNOSIS — Z1231 Encounter for screening mammogram for malignant neoplasm of breast: Secondary | ICD-10-CM

## 2023-08-15 NOTE — Progress Notes (Unsigned)
NEUROLOGY FOLLOW UP OFFICE NOTE  Norma Dunn [id number]  Assessment/Plan:   Vestibular migraine with status migrainosus Migraine without aura, without status migrainosus, not intractable Improved   Migraine prevention:  Emgality Migraine rescue:  rizatriptan 10mg ; Zofran for nausea.  Will have her try sample of Zavzpret to use as second line for times when rizatriptan ineffective Limit use of pain relievers to no more than 2 days out of week to prevent risk of rebound or medication-overuse headache. Keep headache diary Follow up 5 months   Subjective:  Norma Dunn is a 47 year old woman who follows up for migraines.   UPDATE: Migraines started becoming persistent/constant on June 14.  Never without a headache.  Now more dizzy and nauseous.  Her headache has been left sided.  She received a migraine cocktail on 6/24 in the office.  She went to the ED on 7/5 for intractable migraine with left sided numbness and tingling of face and arm.  Notes tightness in left side of neck and pain shooting down to above the left elbow.  Occasional numbness into the hand.  CT head was unremarkable.  She did have altered sleep pattern which may have been a trigger.  However, sleep remains poor due to the headaches (4-5 hours a night).  MRI of brain with and without contrast on 8/16 personally reviewed revealed unchanged Chiari I malformation but otherwise unremarkable.  She was switched from [place] to [place].  She has underwent vestibular rehab.    She went back to work in the middle of October.  No headaches until this past week.  She has had a couple of mild headaches.  No recent dizziness.  She did try Zavzpret once which seemed to work faster than the rizatriptan.  Current NSAIDS:  no Current analgesics:  no Current triptans:  rizatriptan MLT 10mg  Current anti-emetic:  Zofran 4mg  Current muscle relaxants:  no Current anti-anxiolytic:  no Current sleep aide:  no Current Antihypertensive  medications:  no Current Antidepressant medications:  no Current Anticonvulsant medications:  no Current CGRP inhibitor:  Emgality, Zavzpret NS (samples) Current Vitamins/Herbal/Supplements:  no Current Antihistamines/Decongestants:  meclizine Other therapy:  no   Caffeine:  rarely Alcohol:  no Smoker:  no Diet:  Hydrates.  Rarely soda.  Watches diet. Exercise:  yes Depression/anxiety:  No Other pain:  no Sleep hygiene:  Improved.     HISTORY: Onset:  Since her 53s Location:  Usually bi-frontal or either side Quality:  Pressure, sometimes sharp Initial Intensity:  Moderate to severe Aura:  no Prodrome:  forgetful Postdrome:  Sometimes fatigue Associated symptoms:  Nausea, photophobia, phonophobia, blurred vision in left eye, no vomiting.  She has not had any new worse headache of her life, waking up from sleep Initial Duration:  Usually 2 days (but up to a week) Initial Frequency:  Varies.  Some months up to 18 days per month but she may have 1 or 2 months with little or no headache. Initial Frequency of abortive medication: as needed. Triggers:  Menstrual cycle, stress Relieving factors:  Nothing Activity:  Aggravates.  Cannot function or go to work 5 days a month.    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  MRI of brain with and without contrast to evaluate intractable headache on 10/26/2020 showed known Chiari malformation with cerebellar ectopia 9 mm below foramen  magnum, with no evidence of upper cervical cord syrinx.    Past NSAIDS:  Ibuprofen 800mg  Past analgesics:  Excedrin, Fioricet Past abortive triptans:  Sumatriptan (ineffective) Past ergot:  Trudhesa NS Past muscle relaxants:  no Past anti-emetic:  Reglan (effective), Promethazine 25mg  (effective but causes drowsiness) Past  antihypertensive medications:  no Past antidepressant medications:  nortriptyline (elevated blood pressure) Past anticonvulsant medications:  no Past CGRP inhibitor:  Nurtec (rescue), Nicolasa Ducking 60mg  Past vitamins/Herbal/Supplements:  no Other past therapies:  vestibular rehab   Family history of headache:  Father (migraines)   She started having low back pain radiating down the left leg in December 2020.  She saw orthopedics.  MRI of lumbar spine on 11/20/2019 showed mild broad-based disc bulge with mild bilateral foraminal narrowing and bilateral L5 pars interarticularis defects causing  grade 1 anterolisthesis of L5 on S1. Underwent physical therapy  PAST MEDICAL HISTORY: Past Medical History:  Diagnosis Date   Asthma    Headache     MEDICATIONS: Current Outpatient Medications on File Prior to Visit  Medication Sig Dispense Refill   albuterol (VENTOLIN HFA) 108 (90 Base) MCG/ACT inhaler TAKE 2 PUFFS BY MOUTH EVERY 6 HOURS AS NEEDED FOR WHEEZE OR SHORTNESS OF BREATH 18 each 2   Atogepant (QULIPTA) 60 MG TABS Take 1 tablet (60 mg total) by mouth daily. (Patient not taking: Reported on 04/26/2023) 30 tablet 11   estradiol-norethindrone (MIMVEY) 1-0.5 MG tablet Take 1 tablet by mouth daily. 84 tablet 3   Galcanezumab-gnlm (EMGALITY) 120 MG/ML SOAJ Inject 120 mg into the skin every 28 (twenty-eight) days. 1.12 mL 11   IBU 800 MG tablet Take 1 tablet by mouth 2 (two) times daily as needed.     meclizine (ANTIVERT) 25 MG tablet Take 25 mg by mouth as needed.     meloxicam (MOBIC) 15 MG tablet Take 1 tablet (15 mg total) by mouth daily. 30 tablet 2   montelukast (SINGULAIR) 10 MG tablet TAKE 1 TABLET BY MOUTH EVERYDAY AT BEDTIME 90 tablet 1   ondansetron (ZOFRAN) 4 MG tablet Take 1 tablet (4 mg total) by mouth every 8 (eight) hours as needed for nausea or vomiting. 20 tablet 11   rizatriptan (MAXALT-MLT) 10 MG disintegrating tablet TAKE 1 TABLET BY MOUTH AS NEEDED FOR MIGRAINE.  MAY  REPEAT AFTER 2 HOURS.  MAXIMUM 2 TABLETS IN 24 HOURS. 9 tablet 11   No current facility-administered medications on file prior to visit.    ALLERGIES: Allergies  Allergen Reactions   Latex Itching    FAMILY HISTORY: Family History  Problem Relation Age of Onset   Healthy Mother    Migraines Father    Healthy Brother    Healthy Child    Breast cancer Neg Hx       Objective:  Blood pressure 129/85, pulse 73, height 5\' 8"  (1.727 m), weight 197 lb 3.2 oz (89.4 kg). General: No acute distress.  Patient appears well-groomed.   Head:  Normocephalic/atraumatic Eyes:  Fundi examined but not visualized Neck: supple, no paraspinal tenderness, full range of motion Heart:  Regular rate and rhythm Neurological Exam: alert and oriented.  Speech fluent and not dysarthric, language intact.  CN II-XII intact. Bulk and tone normal, muscle strength 5/5 throughout.  Sensation to light touch intact.  Deep tendon reflexes 2+ throughout.  Finger to nose testing intact.  Gait normal, Romberg negative.   Shon Millet, DO

## 2023-08-17 ENCOUNTER — Encounter: Payer: Self-pay | Admitting: Neurology

## 2023-08-17 ENCOUNTER — Ambulatory Visit: Payer: Federal, State, Local not specified - PPO | Admitting: Neurology

## 2023-08-17 ENCOUNTER — Ambulatory Visit
Admission: RE | Admit: 2023-08-17 | Discharge: 2023-08-17 | Disposition: A | Discharge status: Home / Self Care | Payer: Federal, State, Local not specified - PPO | Source: Ambulatory Visit | Attending: Family Medicine | Admitting: Family Medicine

## 2023-08-17 VITALS — BP 129/85 | HR 73 | Ht 68.0 in | Wt 197.2 lb

## 2023-08-17 DIAGNOSIS — G43009 Migraine without aura, not intractable, without status migrainosus: Secondary | ICD-10-CM | POA: Diagnosis not present

## 2023-08-17 DIAGNOSIS — G43809 Other migraine, not intractable, without status migrainosus: Secondary | ICD-10-CM

## 2023-08-17 DIAGNOSIS — Z1231 Encounter for screening mammogram for malignant neoplasm of breast: Secondary | ICD-10-CM | POA: Diagnosis present

## 2023-08-17 NOTE — Patient Instructions (Signed)
Continue Emgality every 4 weeks Take rizatriptan.  May use Zavzpret nasal spray as second line.  Zofran for nausea

## 2023-09-15 ENCOUNTER — Ambulatory Visit: Payer: Federal, State, Local not specified - PPO | Admitting: Neurology

## 2023-10-03 ENCOUNTER — Encounter: Payer: Self-pay | Admitting: Neurology

## 2023-10-03 ENCOUNTER — Telehealth: Payer: Self-pay

## 2023-10-03 NOTE — Telephone Encounter (Signed)
PA needed for Emgality 120 mg

## 2023-10-05 ENCOUNTER — Other Ambulatory Visit: Payer: Self-pay | Admitting: Neurology

## 2023-10-13 ENCOUNTER — Telehealth: Payer: Self-pay

## 2023-10-13 ENCOUNTER — Other Ambulatory Visit (HOSPITAL_COMMUNITY): Payer: Self-pay

## 2023-10-13 NOTE — Telephone Encounter (Signed)
 PA request has been Submitted. New Encounter created for follow up. For additional info see Pharmacy Prior Auth telephone encounter from 10/13/2023.

## 2023-10-13 NOTE — Telephone Encounter (Signed)
 Pharmacy Patient Advocate Encounter   Received notification from Pt Calls Messages that prior authorization for Emgality 120MG /ML auto-injectors (migraine) is required/requested.   Insurance verification completed.   The patient is insured through [facility] .   Per test claim: PA required; PA submitted to above mentioned insurance via CoverMyMeds Key/confirmation #/EOC [id number] Status is pending

## 2023-10-16 ENCOUNTER — Other Ambulatory Visit (HOSPITAL_COMMUNITY): Payer: Self-pay

## 2023-10-16 NOTE — Telephone Encounter (Signed)
 Pharmacy Patient Advocate Encounter  Received notification from [facility] that Prior Authorization for Emgality 120MG /ML auto-injectors (migraine) has been APPROVED from 10-13-2023 to 04-10-2024. Ran test claim, Copay is $35.00. This test claim was processed through [facility]- copay amounts may vary at other pharmacies due to pharmacy/plan contracts, or as the patient moves through the different stages of their insurance plan.   PA #/Case ID/Reference #: [id number]

## 2023-11-18 ENCOUNTER — Other Ambulatory Visit: Payer: Self-pay | Admitting: Pulmonary Disease

## 2024-01-31 ENCOUNTER — Other Ambulatory Visit: Payer: Self-pay | Admitting: Obstetrics and Gynecology

## 2024-01-31 DIAGNOSIS — N951 Menopausal and female climacteric states: Secondary | ICD-10-CM

## 2024-01-31 DIAGNOSIS — Z01419 Encounter for gynecological examination (general) (routine) without abnormal findings: Secondary | ICD-10-CM

## 2024-02-14 NOTE — Progress Notes (Unsigned)
 NEUROLOGY FOLLOW UP OFFICE NOTE  Norma Dunn [id number]  Assessment/Plan:   Vestibular migraine Migraine without aura, without status migrainosus, not intractable    Migraine prevention:  Emgality  Migraine rescue:  rizatriptan  10mg  first line, Zavzpret NS second line; Zofran  for nausea.   Limit use of pain relievers to no more than 9 days out of the month to prevent risk of rebound or medication-overuse headache. Keep headache diary Follow up ***   Subjective:  Norma Dunn is a 48 year old woman who follows up for migraines.   UPDATE: ***  Current NSAIDS:  no Current analgesics:  no Current triptans:  rizatriptan  MLT 10mg  Current anti-emetic:  Zofran  4mg  Current muscle relaxants:  no Current anti-anxiolytic:  no Current sleep aide:  no Current Antihypertensive medications:  no Current Antidepressant medications:  no Current Anticonvulsant medications:  no Current CGRP inhibitor:  Emgality , Zavzpret NS (samples) Current Vitamins/Herbal/Supplements:  no Current Antihistamines/Decongestants:  meclizine Other therapy:  no   Caffeine :  rarely Alcohol:  no Smoker:  no Diet:  Hydrates.  Rarely soda.  Watches diet. Exercise:  yes Depression/anxiety:  No Other pain:  no Sleep hygiene:  Improved.     HISTORY: Onset:  Since her 5s Location:  Usually bi-frontal or either side Quality:  Pressure, sometimes sharp Initial Intensity:  Moderate to severe Aura:  no Prodrome:  forgetful Postdrome:  Sometimes fatigue Associated symptoms:  Nausea, photophobia, phonophobia, blurred vision in left eye, no vomiting.  She has not had any new worse headache of her life, waking up from sleep Initial Duration:  Usually 2 days (but up to a week) Initial Frequency:  Varies.  Some months up to 18 days per month but she may have 1 or 2 months with little or no headache. Initial Frequency of abortive medication: as needed. Triggers:  Menstrual cycle, stress Relieving factors:   Nothing Activity:  Aggravates.  Cannot function or go to work 5 days a month.  Migraines started becoming persistent/constant on February 03, 2023.  Never without a headache.  Now more dizzy and nauseous.  Her headache has been left sided.  She received a migraine cocktail on 6/24 in the office.  She went to the ED on 7/5 for intractable migraine with left sided numbness and tingling of face and arm.  Notes tightness in left side of neck and pain shooting down to above the left elbow.  Occasional numbness into the hand.  CT head was unremarkable.  She did have altered sleep pattern which may have been a trigger.  However, sleep remains poor due to the headaches (4-5 hours a night).  MRI of brain with and without contrast on 8/16 personally reviewed revealed unchanged Chiari I malformation but otherwise unremarkable.  She was switched from Qulipta  to Emgality .  She has underwent vestibular rehab.    Imaging:  09/21/2004: MRI BRAIN:borderline [place] Chiari I malformation" (does not state distance of distension below foramen magnum) and "mild fullness of the pituitary" but otherwise unremarkable. 10/26/2020 MRI BRAIN W WO:  known Chiari malformation with cerebellar ectopia 9 mm below foramen magnum, with no evidence of upper cervical cord syrinx.  04/07/2023 MRI BRAIN W WO:  1. Unchanged Chiari I malformation (extending 9 mm below the foramen magnum and pointed morphology). 2. Otherwise unremarkable appearance of the brain.   Past NSAIDS:  Ibuprofen  800mg  Past analgesics:  Excedrin, Fioricet Past abortive triptans:  Sumatriptan (ineffective) Past ergot:  Trudhesa  NS Past muscle relaxants:  no Past anti-emetic:  Reglan  (effective),  Promethazine 25mg  (effective but causes drowsiness) Past antihypertensive medications:  no Past antidepressant medications:  nortriptyline  (elevated blood pressure) Past anticonvulsant medications:  no Past CGRP inhibitor:  Nurtec (rescue), Ubrelvy, Qulipta  60mg  Past  vitamins/Herbal/Supplements:  no Other past therapies:  vestibular rehab   Family history of headache:  Father (migraines)   Low back pain: She started having low back pain radiating down the left leg in December 2020.  She saw orthopedics.  MRI of lumbar spine on 11/20/2019 showed mild broad-based disc bulge with mild bilateral foraminal narrowing and bilateral L5 pars interarticularis defects causing  grade 1 anterolisthesis of L5 on S1. Underwent physical therapy  PAST MEDICAL HISTORY: Past Medical History:  Diagnosis Date   Asthma    Headache     MEDICATIONS: Current Outpatient Medications on File Prior to Visit  Medication Sig Dispense Refill   albuterol  (VENTOLIN  HFA) 108 (90 Base) MCG/ACT inhaler TAKE 2 PUFFS BY MOUTH EVERY 6 HOURS AS NEEDED FOR WHEEZE OR SHORTNESS OF BREATH 18 each 2   estradiol -norethindrone  (ACTIVELLA) 1-0.5 MG tablet TAKE 1 TABLET BY MOUTH EVERY DAY 84 tablet 0   Galcanezumab -gnlm (EMGALITY ) 120 MG/ML SOAJ Inject 120 mg into the skin every 28 (twenty-eight) days. 1.12 mL 11   IBU 800 MG tablet Take 1 tablet by mouth 2 (two) times daily as needed.     meclizine (ANTIVERT) 25 MG tablet Take 25 mg by mouth as needed.     montelukast  (SINGULAIR ) 10 MG tablet TAKE 1 TABLET BY MOUTH EVERYDAY AT BEDTIME 90 tablet 2   ondansetron  (ZOFRAN ) 4 MG tablet Take 1 tablet (4 mg total) by mouth every 8 (eight) hours as needed for nausea or vomiting. 20 tablet 11   rizatriptan  (MAXALT -MLT) 10 MG disintegrating tablet TAKE 1 TABLET BY MOUTH AS NEEDED FOR MIGRAINE. MAY REPEAT AFTER 2 HOURS. MAXIMUM 2 TABLETS IN 24 HOURS. 9 tablet 11   No current facility-administered medications on file prior to visit.    ALLERGIES: Allergies  Allergen Reactions   Latex Itching    FAMILY HISTORY: Family History  Problem Relation Age of Onset   Healthy Mother    Migraines Father    Healthy Brother    Healthy Child    Breast cancer Neg Hx       Objective:  *** General: No acute  distress.  Patient appears well-groomed.   ***   Juliene Dunnings, DO

## 2024-02-15 ENCOUNTER — Encounter: Payer: Self-pay | Admitting: Neurology

## 2024-02-15 ENCOUNTER — Ambulatory Visit: Payer: Federal, State, Local not specified - PPO | Admitting: Neurology

## 2024-02-15 VITALS — BP 130/87 | HR 66 | Ht 65.0 in | Wt 195.0 lb

## 2024-02-15 DIAGNOSIS — G43009 Migraine without aura, not intractable, without status migrainosus: Secondary | ICD-10-CM

## 2024-02-15 DIAGNOSIS — G43809 Other migraine, not intractable, without status migrainosus: Secondary | ICD-10-CM

## 2024-02-15 MED ORDER — ONDANSETRON HCL 4 MG PO TABS: 4.0000 mg | ORAL_TABLET | Freq: Three times a day (TID) | ORAL | 11 refills | Status: AC | PRN

## 2024-02-15 MED ORDER — ZAVZPRET 10 MG/ACT NA SOLN: 1.0000 | Freq: Every day | NASAL | 11 refills | Status: DC | PRN

## 2024-02-15 MED ORDER — EMGALITY 120 MG/ML ~~LOC~~ SOAJ: 120.0000 mg | SUBCUTANEOUS | 11 refills | Status: AC

## 2024-02-15 MED ORDER — TOPIRAMATE 25 MG PO TABS: 25.0000 mg | ORAL_TABLET | Freq: Every day | ORAL | 5 refills | Status: DC

## 2024-02-15 NOTE — Patient Instructions (Signed)
  Continue Emgality  Start topiramate 25mg  at bedtime.  Contact us  in 4 weeks with update and we can increase dose if needed. Take rizatriptan  at earliest onset of headache.  May repeat dose once in 2 hours if needed.  Maximum 2 tablets in 24 hours.  Zavzpret as second line.  Zofran  for nausea Limit use of pain relievers to no more than 9 days out of the month.  These medications include acetaminophen, NSAIDs (ibuprofen /Advil /Motrin , naproxen/Aleve, triptans (Imitrex/sumatriptan), Excedrin, and narcotics.  This will help reduce risk of rebound headaches. Be aware of common food triggers Routine exercise Stay adequately hydrated (aim for 64 oz water daily) Keep headache diary Maintain proper stress management Maintain proper sleep hygiene Do not skip meals

## 2024-02-22 ENCOUNTER — Ambulatory Visit: Admitting: Obstetrics and Gynecology

## 2024-02-22 ENCOUNTER — Encounter: Payer: Self-pay | Admitting: Pulmonary Disease

## 2024-02-22 ENCOUNTER — Ambulatory Visit: Admitting: Pulmonary Disease

## 2024-02-22 VITALS — BP 130/90 | HR 71 | Temp 98.4°F | Ht 65.0 in | Wt 196.4 lb

## 2024-02-22 DIAGNOSIS — Z87891 Personal history of nicotine dependence: Secondary | ICD-10-CM

## 2024-02-22 DIAGNOSIS — J4520 Mild intermittent asthma, uncomplicated: Secondary | ICD-10-CM | POA: Diagnosis not present

## 2024-02-22 LAB — NITRIC OXIDE: Nitric Oxide: 11

## 2024-02-22 NOTE — Patient Instructions (Addendum)
 VISIT SUMMARY:  You came in today for a follow-up on your asthma management. Your asthma is well-controlled, and you have not experienced any recent flare-ups or needed to use your rescue inhaler. Your current medications, acetalbutyral and montelukast , are effectively managing your condition.  YOUR PLAN:  -ASTHMA: Asthma is a condition where your airways narrow and swell, producing extra mucus, which can make breathing difficult. Your asthma is well-controlled with your current medications, albuterol  and montelukast . We will continue with these medications.  We performed nitric oxide test today which showed that your airway inflammation is well-controlled.   INSTRUCTIONS:  Please continue taking your medications as prescribed.  Please schedule a follow-up appointment in one year.

## 2024-02-22 NOTE — Progress Notes (Signed)
 Subjective:    Patient ID: Norma Dunn, female    DOB: 1975/11/09, 48 y.o.   MRN: [id number]  Patient Care Team: [facility], [facility] as PCP - General (General Practice) Skeet Juliene SAUNDERS, DO as Consulting Physician (Neurology)  Chief Complaint  Patient presents with   Follow-up    BACKGROUND:Patient is a 48 year old very remote former smoker (cigars, quit 1999) who presents for follow-up on mild intermittent asthma without complication.  Patient presents for 51-month follow-up.  He is currently maintained on albuterol  as needed and Singulair  for management of chronic rhinitis.  Last seen on 26 April 2023 since that visit she has not had any exacerbations.     HPI Discussed the use of AI scribe software for clinical note transcription with the patient, who gave verbal consent to proceed.  History of Present Illness   Norma Dunn is a 48 year old female with asthma who presents for follow-up on her asthma management.  She has experienced no recent asthma flare-ups and has not needed to use her rescue inhaler. Her asthma is currently managed with as needed albuterol  and montelukast .  Previous pulmonary function tests have shown minimal obstruction.  No recent use of her rescue inhaler and no recent asthma flare-ups.  She usually has difficulties on the spring of the year.  No issues this year.  No recent fevers chills or sweats.  No cough or sputum production.  She does did not endorse need for daily maintenance inhaler.   Overall she feels well and looks well.  DATA 02/10/2021 PFTs: FEV1 2.48 L or 97% predicted, FVC 2.95 L or 94% predicted, FEV1/FVC 84%, lung volumes normal.  Diffusion capacity corrects for alveolar volume.  Normal study overall. 11/17/2022 PFTs: FEV1 2.44 L or 81% predicted, FVC 3.06 L or 81% predicted, lung volumes within normal limits.  There is significant bronchodilator response noted by decreased airway resistance.  Diffusion capacity is normal.   Consistent with minimal obstruction, asthmatic type  Review of Systems A 10 point review of systems was performed and it is as noted above otherwise negative.   Past Medical History:  Diagnosis Date   Asthma    Headache     Past Surgical History:  Procedure Laterality Date   OOPHORECTOMY  2005   right   TUBAL LIGATION     left   TUBAL LIGATION     left    Patient Active Problem List   Diagnosis Date Noted   Pelvic pain in female 04/21/2022   Mild intermittent asthma without complication 04/21/2022   Personal history of COVID-19 04/21/2022    Family History  Problem Relation Age of Onset   Healthy Mother    Migraines Father    Healthy Brother    Healthy Child    Breast cancer Neg Hx     Social History   Tobacco Use   Smoking status: Former    Types: Cigars    Quit date: 08/22/1997    Years since quitting: 26.5   Smokeless tobacco: Never   Tobacco comments:    not a daily smoker.  Substance Use Topics   Alcohol use: No    Allergies  Allergen Reactions   Latex Itching    Current Meds  Medication Sig   albuterol  (VENTOLIN  HFA) 108 (90 Base) MCG/ACT inhaler TAKE 2 PUFFS BY MOUTH EVERY 6 HOURS AS NEEDED FOR WHEEZE OR SHORTNESS OF BREATH   estradiol -norethindrone  (ACTIVELLA) 1-0.5 MG tablet TAKE 1 TABLET BY MOUTH EVERY  DAY   Galcanezumab -gnlm (EMGALITY ) 120 MG/ML SOAJ Inject 120 mg into the skin every 28 (twenty-eight) days.   IBU 800 MG tablet Take 1 tablet by mouth 2 (two) times daily as needed.   meclizine (ANTIVERT) 25 MG tablet Take 25 mg by mouth as needed.   montelukast  (SINGULAIR ) 10 MG tablet TAKE 1 TABLET BY MOUTH EVERYDAY AT BEDTIME   ondansetron  (ZOFRAN ) 4 MG tablet Take 1 tablet (4 mg total) by mouth every 8 (eight) hours as needed for nausea or vomiting.   rizatriptan  (MAXALT -MLT) 10 MG disintegrating tablet TAKE 1 TABLET BY MOUTH AS NEEDED FOR MIGRAINE. MAY REPEAT AFTER 2 HOURS. MAXIMUM 2 TABLETS IN 24 HOURS.   topiramate  (TOPAMAX ) 25 MG tablet  Take 1 tablet (25 mg total) by mouth at bedtime.   Zavegepant HCl (ZAVZPRET ) 10 MG/ACT SOLN Place 1 spray into the nose daily as needed.    Immunization History  Administered Date(s) Administered   Influenza,inj,Quad PF,6+ Mos 05/02/2017   Influenza-Unspecified 04/29/2019, 05/19/2021   Janssen (J&J) SARS-COV-2 Vaccination 01/08/2020        Objective:     BP (!) 130/90 (BP Location: Left Arm, Patient Position: Sitting, Cuff Size: Normal)   Pulse 71   Temp 98.4 F (36.9 C) (Oral)   Ht 5' 5 (1.651 m)   Wt 196 lb 6.4 oz (89.1 kg)   SpO2 97%   BMI 32.68 kg/m   SpO2: 97 %  GENERAL: Well-developed, overweight woman, no acute distress fully ambulatory, no conversational dyspnea HEAD: Normocephalic, atraumatic.  EYES: Pupils equal, round, reactive to light.  No scleral icterus.  MOUTH: Dentition intact, oral mucosa moist. NECK: Supple. No thyromegaly. Trachea midline. No JVD.  No adenopathy. PULMONARY: Good air entry bilaterally.  No adventitious sounds. CARDIOVASCULAR: S1 and S2. Regular rate and rhythm.  No rubs, murmurs or gallops heard. ABDOMEN: Benign. MUSCULOSKELETAL: No joint deformity, no clubbing, no edema.  NEUROLOGIC: No focal deficits, no gait disturbance, speech is fluent. SKIN: Intact,warm,dry.  On limited exam, no rashes. PSYCH: Mood and behavior are normal.   Lab Results  Component Value Date   NITRICOXIDE 11 02/22/2024  *No evidence of type II inflammation       Assessment & Plan:     ICD-10-CM   1. Mild intermittent asthma without complication  J45.20 Nitric oxide       Orders Placed This Encounter  Procedures   Nitric oxide    Discussion:    Mild intermittent asthma  Asthma is well-controlled with no recent exacerbations. Maintained on as needed albuterol  and montelukast  with no recent use of albuterol  inhaler. Previous pulmonary function tests showed minimal obstruction of the asthmatic type.  Nitric oxide  today only 11 ppb consistent with no  significant type II inflammation - Continue as needed albuterol  and montelukast  - Schedule follow-up in one year or as needed     Advised if symptoms do not improve or worsen, to please contact office for sooner follow up or seek emergency care.    I spent 25 minutes of dedicated to the care of this patient on the date of this encounter to include pre-visit review of records, face-to-face time with the patient discussing conditions above, post visit ordering of testing, clinical documentation with the electronic health record, making appropriate referrals as documented, and communicating necessary findings to members of the patients care team.   C. Leita Sanders, MD Advanced Bronchoscopy PCCM [facility]    *This note was dictated using voice recognition software/Dragon.  Despite best efforts to proofread,  errors can occur which can change the meaning. Any transcriptional errors that result from this process are unintentional and may not be fully corrected at the time of dictation.

## 2024-02-27 ENCOUNTER — Encounter: Payer: Self-pay | Admitting: Pulmonary Disease

## 2024-03-08 ENCOUNTER — Telehealth: Payer: Self-pay | Admitting: Pharmacy Technician

## 2024-03-08 ENCOUNTER — Other Ambulatory Visit (HOSPITAL_COMMUNITY): Payer: Self-pay

## 2024-03-08 NOTE — Telephone Encounter (Signed)
 Pharmacy Patient Advocate Encounter  Received notification from [facility] that Prior Authorization for EMGALITY  120MG  has been APPROVED from 6.18.25 to 7.18.26. Unable to obtain price due to refill too soon rejection, last fill date 7.17.25 next available fill date7.31.25

## 2024-03-08 NOTE — Telephone Encounter (Signed)
 Pharmacy Patient Advocate Encounter   Received notification from CoverMyMeds that prior authorization for EMGALITY  120MG  is required/requested.   Insurance verification completed.   The patient is insured through [facility] .   Per test claim: PA required; PA submitted to above mentioned insurance via CoverMyMeds Key/confirmation #/EOC [id number] Status is pending

## 2024-03-15 ENCOUNTER — Telehealth: Payer: Self-pay

## 2024-03-15 ENCOUNTER — Other Ambulatory Visit (HOSPITAL_COMMUNITY): Payer: Self-pay

## 2024-03-15 NOTE — Telephone Encounter (Signed)
 Pharmacy Patient Advocate Encounter   Received notification from CoverMyMeds that prior authorization for Zavzpret  10MG /ACT solution is required/requested.   Insurance verification completed.   The patient is insured through [facility] .   Per test claim: PA required; PA submitted to above mentioned insurance via CoverMyMeds Key/confirmation #/EOC [id number] Status is pending

## 2024-03-19 NOTE — Telephone Encounter (Signed)
 Pharmacy Patient Advocate Encounter  Prior Authorization form/request asks a question that requires your assistance. Please see the question below and advise accordingly. The PA will not be submitted until the necessary information is received.

## 2024-03-20 ENCOUNTER — Other Ambulatory Visit (HOSPITAL_COMMUNITY)
Admission: RE | Admit: 2024-03-20 | Discharge: 2024-03-20 | Disposition: A | Discharge status: Home / Self Care | Source: Ambulatory Visit | Attending: Obstetrics and Gynecology | Admitting: Obstetrics and Gynecology

## 2024-03-20 ENCOUNTER — Ambulatory Visit: Admitting: Obstetrics and Gynecology

## 2024-03-20 ENCOUNTER — Encounter: Payer: Self-pay | Admitting: Obstetrics and Gynecology

## 2024-03-20 VITALS — BP 120/82 | HR 79 | Ht 65.0 in | Wt 195.2 lb

## 2024-03-20 DIAGNOSIS — Z1151 Encounter for screening for human papillomavirus (HPV): Secondary | ICD-10-CM | POA: Diagnosis not present

## 2024-03-20 DIAGNOSIS — Z01419 Encounter for gynecological examination (general) (routine) without abnormal findings: Secondary | ICD-10-CM | POA: Diagnosis present

## 2024-03-20 DIAGNOSIS — Z124 Encounter for screening for malignant neoplasm of cervix: Secondary | ICD-10-CM | POA: Diagnosis present

## 2024-03-20 DIAGNOSIS — Z1231 Encounter for screening mammogram for malignant neoplasm of breast: Secondary | ICD-10-CM

## 2024-03-20 DIAGNOSIS — N951 Menopausal and female climacteric states: Secondary | ICD-10-CM | POA: Diagnosis not present

## 2024-03-20 MED ORDER — MEDROXYPROGESTERONE ACETATE 2.5 MG PO TABS
10.0000 mg | ORAL_TABLET | Freq: Every day | ORAL | 3 refills | Status: AC
Start: 2024-03-20 — End: 2025-03-20

## 2024-03-20 MED ORDER — ESTROGENS CONJUGATED 0.9 MG PO TABS
0.9000 mg | ORAL_TABLET | Freq: Every day | ORAL | 3 refills | Status: AC
Start: 2024-03-20 — End: 2025-03-20

## 2024-03-20 MED ORDER — ESTRADIOL-NORETHINDRONE ACET 1-0.5 MG PO TABS
1.0000 | ORAL_TABLET | Freq: Every day | ORAL | 3 refills | Status: DC
Start: 2024-03-20 — End: 2024-03-20

## 2024-03-20 NOTE — Progress Notes (Signed)
 HPI:      [name] is a 48 y.o. [phone] who LMP was Patient's last menstrual period was 10/23/2021 (exact date).  Subjective:   She presents today for her annual examination.  She states that she continues to take her HRT as directed.  She reports that her hot flashes have seem to return over the last month and she has had 1 day of spotting with symptoms almost like a menstrual period. Of significant note, patient was begun on Topamax  at about the same time that her symptoms returned.  She would like to alleviate these new onset menopausal symptoms.    Hx: The following portions of the patient's history were reviewed and updated as appropriate:             She  has a past medical history of Asthma and Headache. She does not have any pertinent problems on file. She  has a past surgical history that includes Oophorectomy (2005); Tubal ligation; and Tubal ligation. Her family history includes Healthy in her brother, child, and mother; Migraines in her father. She  reports that she quit smoking about 26 years ago. Her smoking use included cigars. She has never used smokeless tobacco. She reports that she does not drink alcohol and does not use drugs. She has a current medication list which includes the following prescription(s): albuterol , estrogens  (conjugated), emgality , ibu, meclizine, medroxyprogesterone , montelukast , ondansetron , rizatriptan , topiramate , and zavzpret . She is allergic to latex.       Review of Systems:  Review of Systems  Constitutional: Denied constitutional symptoms, night sweats, recent illness, fatigue, fever, insomnia and weight loss.  Eyes: Denied eye symptoms, eye pain, photophobia, vision change and visual disturbance.  Ears/Nose/Throat/Neck: Denied ear, nose, throat or neck symptoms, hearing loss, nasal discharge, sinus congestion and sore throat.  Cardiovascular: Denied cardiovascular symptoms, arrhythmia, chest pain/pressure, edema, exercise intolerance,  orthopnea and palpitations.  Respiratory: Denied pulmonary symptoms, asthma, pleuritic pain, productive sputum, cough, dyspnea and wheezing.  Gastrointestinal: Denied, gastro-esophageal reflux, melena, nausea and vomiting.  Genitourinary: See HPI for additional information.  Musculoskeletal: Denied musculoskeletal symptoms, stiffness, swelling, muscle weakness and myalgia.  Dermatologic: Denied dermatology symptoms, rash and scar.  Neurologic: Denied neurology symptoms, dizziness, headache, neck pain and syncope.  Psychiatric: Denied psychiatric symptoms, anxiety and depression.  Endocrine: See HPI for additional information.   Meds:   Current Outpatient Medications on File Prior to Visit  Medication Sig Dispense Refill   albuterol  (VENTOLIN  HFA) 108 (90 Base) MCG/ACT inhaler TAKE 2 PUFFS BY MOUTH EVERY 6 HOURS AS NEEDED FOR WHEEZE OR SHORTNESS OF BREATH 18 each 2   Galcanezumab -gnlm (EMGALITY ) 120 MG/ML SOAJ Inject 120 mg into the skin every 28 (twenty-eight) days. 1.12 mL 11   IBU 800 MG tablet Take 1 tablet by mouth 2 (two) times daily as needed.     meclizine (ANTIVERT) 25 MG tablet Take 25 mg by mouth as needed.     montelukast  (SINGULAIR ) 10 MG tablet TAKE 1 TABLET BY MOUTH EVERYDAY AT BEDTIME 90 tablet 2   ondansetron  (ZOFRAN ) 4 MG tablet Take 1 tablet (4 mg total) by mouth every 8 (eight) hours as needed for nausea or vomiting. 20 tablet 11   rizatriptan  (MAXALT -MLT) 10 MG disintegrating tablet TAKE 1 TABLET BY MOUTH AS NEEDED FOR MIGRAINE. MAY REPEAT AFTER 2 HOURS. MAXIMUM 2 TABLETS IN 24 HOURS. 9 tablet 11   topiramate  (TOPAMAX ) 25 MG tablet Take 1 tablet (25 mg total) by mouth at bedtime. 30 tablet 5  Zavegepant HCl (ZAVZPRET ) 10 MG/ACT SOLN Place 1 spray into the nose daily as needed. 6 each 11   No current facility-administered medications on file prior to visit.     Objective:     Vitals:   03/20/24 1316  BP: 120/82  Pulse: 79    Filed Weights   03/20/24 1316   Weight: 195 lb 3.2 oz (88.5 kg)              Physical examination General NAD, Conversant  HEENT Atraumatic; Op clear with mmm.  Normo-cephalic.  Anicteric sclerae  Thyroid/Neck Smooth without nodularity or enlargement. Normal ROM.  Neck Supple.  Skin No rashes, lesions or ulceration. Normal palpated skin turgor. No nodularity.  Breasts: No masses or discharge.  Symmetric.  No axillary adenopathy.  Lungs: Clear to auscultation.No rales or wheezes. Normal Respiratory effort, no retractions.  Heart: NSR.  No murmurs or rubs appreciated. No peripheral edema  Abdomen: Soft.  Non-tender.  No masses.  No HSM. No hernia  Extremities: Moves all appropriately.  Normal ROM for age. No lymphadenopathy.  Neuro: Oriented to PPT.  Normal mood. Normal affect.     Pelvic:   Vulva: Normal appearance.  No lesions.  Vagina: No lesions or abnormalities noted.  Support: Normal pelvic support.  Urethra No masses tenderness or scarring.  Meatus Normal size without lesions or prolapse.  Cervix: Normal appearance.  No lesions.  Anus: Normal exam.  No lesions.  Perineum: Normal exam.  No lesions.        Bimanual   Uterus: Normal size.  Non-tender.  Mobile.  AV.  Adnexae: No masses.  Non-tender to palpation.  Cul-de-sac: Negative for abnormality.     Assessment:    [id number] Patient Active Problem List   Diagnosis Date Noted   Pelvic pain in female 04/21/2022   Mild intermittent asthma without complication 04/21/2022   Personal history of COVID-19 04/21/2022     1. Well woman exam with routine gynecological exam   2. Cervical cancer screening   3. Symptomatic menopausal or female climacteric states   4. Screening mammogram for breast cancer     Menopausal symptoms have seemingly returned.  May be an effect of the new start of Topamax  which she is taking for her migraines.  Topamax  has known interactions with ERT sometimes causing an exacerbation of menopausal symptoms or occasional  spotting.   Plan:            1.  Basic Screening Recommendations The basic screening recommendations for asymptomatic women were discussed with the patient during her visit.  The age-appropriate recommendations were discussed with her and the rational for the tests reviewed.  When I am informed by the patient that another primary care physician has previously obtained the age-appropriate tests and they are up-to-date, only outstanding tests are ordered and referrals given as necessary.  Abnormal results of tests will be discussed with her when all of her results are completed.  Routine preventative health maintenance measures emphasized: Exercise/Diet/Weight control, Tobacco Warnings, Alcohol/Substance use risks and Stress Management Pap performed-mammogram ordered-blood work ordered 2.  Increase estrogen dose to 0.9 and 2.5 progesterone.  Orders Orders Placed This Encounter  Procedures   Basic metabolic panel with GFR   CBC   Hemoglobin A1c   Lipid panel   TSH     Meds ordered this encounter  Medications   DISCONTD: estradiol -norethindrone  (ACTIVELLA) 1-0.5 MG tablet    Sig: Take 1 tablet by mouth daily.    Dispense:  84 tablet    Refill:  3   estrogens , conjugated, (PREMARIN ) 0.9 MG tablet    Sig: Take 1 tablet (0.9 mg total) by mouth daily.    Dispense:  90 tablet    Refill:  3   medroxyPROGESTERone  (PROVERA ) 2.5 MG tablet    Sig: Take 4 tablets (10 mg total) by mouth daily.    Dispense:  90 tablet    Refill:  3          F/U  Return in about 1 year (around 03/20/2025) for Annual Physical.  Alm DOROTHA Sar, M.D. 03/20/2024 1:57 PM

## 2024-03-20 NOTE — Progress Notes (Signed)
 Patients presents for annual exam today. She states continuing to use daily Activella but states her menopausal symptoms have recently restarted, reports she started taking Topamax  within the last month as well. Due for pap smear and mammogram, ordered. Annual labs are ordered. She states no other questions or concerns at this time.

## 2024-03-21 ENCOUNTER — Other Ambulatory Visit (HOSPITAL_COMMUNITY): Payer: Self-pay

## 2024-03-21 ENCOUNTER — Telehealth: Payer: Self-pay | Admitting: Pharmacy Technician

## 2024-03-21 LAB — CBC
Hematocrit: 39 % (ref 34.0–46.6)
Hemoglobin: 12.6 g/dL (ref 11.1–15.9)
MCH: 28.8 pg (ref 26.6–33.0)
MCHC: 32.3 g/dL (ref 31.5–35.7)
MCV: 89 fL (ref 79–97)
Platelets: 301 x10E3/uL (ref 150–450)
RBC: 4.37 x10E6/uL (ref 3.77–5.28)
RDW: 12.9 % (ref 11.7–15.4)
WBC: 8.4 x10E3/uL (ref 3.4–10.8)

## 2024-03-21 LAB — BASIC METABOLIC PANEL WITH GFR
BUN/Creatinine Ratio: 12 (ref 9–23)
BUN: 13 mg/dL (ref 6–24)
CO2: 18 mmol/L — ABNORMAL LOW (ref 20–29)
Calcium: 9.1 mg/dL (ref 8.7–10.2)
Chloride: 108 mmol/L — ABNORMAL HIGH (ref 96–106)
Creatinine, Ser: 1.13 mg/dL — ABNORMAL HIGH (ref 0.57–1.00)
Glucose: 71 mg/dL (ref 70–99)
Potassium: 4.7 mmol/L (ref 3.5–5.2)
Sodium: 141 mmol/L (ref 134–144)
eGFR: 60 mL/min/1.73 (ref >59)

## 2024-03-21 LAB — LIPID PANEL
Chol/HDL Ratio: 2.9 ratio (ref 0.0–4.4)
Cholesterol, Total: 164 mg/dL (ref 100–199)
HDL: 57 mg/dL (ref >39)
LDL Chol Calc (NIH): 91 mg/dL (ref 0–99)
Triglycerides: 84 mg/dL (ref 0–149)
VLDL Cholesterol Cal: 16 mg/dL (ref 5–40)

## 2024-03-21 LAB — TSH: TSH: 0.876 u[IU]/mL (ref 0.450–4.500)

## 2024-03-21 LAB — HEMOGLOBIN A1C
Est. average glucose Bld gHb Est-mCnc: 117 mg/dL
Hgb A1c MFr Bld: 5.7 % — ABNORMAL HIGH (ref 4.8–5.6)

## 2024-03-21 NOTE — Telephone Encounter (Signed)
 Pharmacy Patient Advocate Encounter  Per test claim: PA required; PA submitted to above mentioned insurance via CoverMyMeds Key/confirmation #/EOC [id number] Status is pending

## 2024-03-21 NOTE — Telephone Encounter (Signed)
 Pharmacy Patient Advocate Encounter   Received notification from CoverMyMeds that prior authorization for ZAVZPRET  10MG  is required/requested.   Insurance verification completed.   The patient is insured through [facility] .   Per test claim: PA required; PA submitted to above mentioned insurance via CoverMyMeds Key/confirmation #/EOC [id number] Status is pending

## 2024-03-21 NOTE — Telephone Encounter (Signed)
 Pharmacy Patient Advocate Encounter  Received notification from [facility] that Prior Authorization for ZAVZPRET   has been DENIED.  Full denial letter will be uploaded to the media tab. See denial reason below.    PA #/Case ID/Reference #: [id number]   UPDATED ANSWER TO SOME OF THE QUESTIONS AND RESUBMITTED. PA has been submitted, and telephone encounter has been created. Please see telephone encounter dated 7.31.25.

## 2024-03-22 ENCOUNTER — Other Ambulatory Visit (HOSPITAL_COMMUNITY): Payer: Self-pay

## 2024-03-22 NOTE — Telephone Encounter (Signed)
 Pharmacy Patient Advocate Encounter  Received notification from [facility] that Prior Authorization for ZAVZPRET  10MG  has been APPROVED from 7.1.25 to 1.27.26. Ran test claim, Copay is $96.96. This test claim was processed through [facility]- copay amounts may vary at other pharmacies due to pharmacy/plan contracts, or as the patient moves through the different stages of their insurance plan.   PA #/Case ID/Reference #: [id number]

## 2024-03-22 NOTE — Telephone Encounter (Signed)
 PA is approved. See other encounter

## 2024-03-26 LAB — CYTOLOGY - PAP
Comment: NEGATIVE
Diagnosis: NEGATIVE
High risk HPV: NEGATIVE

## 2024-04-05 ENCOUNTER — Encounter: Payer: Self-pay | Admitting: Neurology

## 2024-04-08 ENCOUNTER — Encounter: Payer: Self-pay | Admitting: Obstetrics and Gynecology

## 2024-04-23 ENCOUNTER — Telehealth: Payer: Self-pay | Admitting: Neurology

## 2024-04-23 NOTE — Telephone Encounter (Signed)
 Pt came in this morning to drop off some FMLA documents and it is in your box.

## 2024-04-26 NOTE — Telephone Encounter (Signed)
 On Dr.Jaffe desk waiting to be signed.

## 2024-04-29 NOTE — Telephone Encounter (Signed)
 Completed and given to the front desk. Patient copy,  one sent to scan.

## 2024-04-30 ENCOUNTER — Other Ambulatory Visit (HOSPITAL_COMMUNITY): Payer: Self-pay

## 2024-05-01 ENCOUNTER — Other Ambulatory Visit (HOSPITAL_COMMUNITY): Payer: Self-pay

## 2024-05-02 ENCOUNTER — Encounter: Payer: Self-pay | Admitting: Neurology

## 2024-05-02 DIAGNOSIS — Z0279 Encounter for issue of other medical certificate: Secondary | ICD-10-CM

## 2024-08-15 ENCOUNTER — Other Ambulatory Visit: Payer: Self-pay | Admitting: Pulmonary Disease

## 2024-09-02 ENCOUNTER — Encounter: Payer: Self-pay | Admitting: Pulmonary Disease

## 2024-09-02 ENCOUNTER — Ambulatory Visit: Admitting: Pulmonary Disease

## 2024-09-02 VITALS — BP 130/88 | HR 75 | Temp 98.0°F | Ht 65.0 in | Wt 194.0 lb

## 2024-09-02 DIAGNOSIS — Z87891 Personal history of nicotine dependence: Secondary | ICD-10-CM

## 2024-09-02 DIAGNOSIS — J4520 Mild intermittent asthma, uncomplicated: Secondary | ICD-10-CM

## 2024-09-02 LAB — NITRIC OXIDE: Nitric Oxide: 8

## 2024-09-02 MED ORDER — ALBUTEROL SULFATE HFA 108 (90 BASE) MCG/ACT IN AERS: 2.0000 | INHALATION_SPRAY | Freq: Four times a day (QID) | RESPIRATORY_TRACT | 2 refills | Status: AC | PRN

## 2024-09-02 MED ORDER — MONTELUKAST SODIUM 10 MG PO TABS: 10.0000 mg | ORAL_TABLET | Freq: Every day | ORAL | 3 refills | Status: AC

## 2024-09-02 NOTE — Progress Notes (Addendum)
 "  Subjective:    Patient ID: Norma Dunn, female    DOB: November 24, 1975, 49 y.o.   MRN: [id number]  Patient Care Team: Center, [facility] as PCP - General (General Practice) Skeet Juliene SAUNDERS, DO as Consulting Physician (Neurology)  Chief Complaint  Patient presents with   Asthma    No breathing problems.     BACKGROUND/INTERVAL:Patient is a 49 year old very remote former smoker (cigars, quit 1999) who presents for follow-up on mild intermittent asthma without complication.  Patient presents for overdue 33-month follow-up.  He is currently maintained on albuterol  as needed and Singulair  for management of chronic rhinitis.  Last seen on 22 February 2024 since that visit she has not had any exacerbations.    HPI Discussed the use of AI scribe software for clinical note transcription with the patient, who gave verbal consent to proceed.  History of Present Illness   Norma Dunn is a 49 year old female with mild intermittent asthma who presents for follow-up.  She uses albuterol  as needed and takes Singulair  regularly. She had to use her inhaler a couple of nights ago but has not experienced any recent flare-ups.  No persistent shortness of breath or significant congestion. She describes her symptoms as 'pretty good'.  She requests a refill for her albuterol  inhaler as she is running low.   He has not had any fevers, chills or sweats.  No cough or sputum production.  No hemoptysis.  No chest pain.  No calf tenderness.  No orthopnea or paroxysmal nocturnal dyspnea.  No gastroesophageal reflux symptoms.  Nasal congestion well-controlled with the use of montelukast .    DATA 02/10/2021 PFTs: FEV1 2.48 L or 97% predicted, FVC 2.95 L or 94% predicted, FEV1/FVC 84%, lung volumes normal.  Diffusion capacity corrects for alveolar volume.  Normal study overall. 11/17/2022 PFTs: FEV1 2.44 L or 81% predicted, FVC 3.06 L or 81% predicted, lung volumes within normal limits.  There is significant  bronchodilator response noted by decreased airway resistance.  Diffusion capacity is normal.  Consistent with minimal obstruction, asthmatic type.  Review of Systems A 10 point review of systems was performed and it is as noted above otherwise negative.   Patient Active Problem List   Diagnosis Date Noted   Pelvic pain in female 04/21/2022   Mild intermittent asthma without complication 04/21/2022   Personal history of COVID-19 04/21/2022    Social History   Tobacco Use   Smoking status: Former    Types: Cigars    Quit date: 08/22/1997    Years since quitting: 27.0   Smokeless tobacco: Never   Tobacco comments:    not a daily smoker.  Substance Use Topics   Alcohol use: No    Allergies[1]  Active Medications[2]  Immunization History  Administered Date(s) Administered   Influenza,inj,Quad PF,6+ Mos 05/02/2017   Influenza-Unspecified 04/29/2019, 05/19/2021   Janssen (J&J) SARS-COV-2 Vaccination 01/08/2020        Objective:     Vitals:   09/02/24 1401  BP: 130/88  Pulse: 75  Temp: 98 F (36.7 C)  Height: 5' 5 (1.651 m)  Weight: 194 lb (88 kg)  SpO2: 97%  TempSrc: Temporal  BMI (Calculated): 32.28     SpO2: 97 %  GENERAL: Well-developed, overweight woman, no acute distress fully ambulatory, no conversational dyspnea HEAD: Normocephalic, atraumatic.  EYES: Pupils equal, round, reactive to light.  No scleral icterus.  MOUTH: Dentition intact, oral mucosa moist. NECK: Supple. No thyromegaly. Trachea midline. No JVD.  No adenopathy. PULMONARY: Good air entry bilaterally.  No adventitious sounds. CARDIOVASCULAR: S1 and S2. Regular rate and rhythm.  No rubs, murmurs or gallops heard. ABDOMEN: Benign. MUSCULOSKELETAL: No joint deformity, no clubbing, no edema.  NEUROLOGIC: No focal deficits, no gait disturbance, speech is fluent. SKIN: Intact,warm,dry.  On limited exam, no rashes. PSYCH: Mood and behavior are normal.   Lab Results  Component Value Date    NITRICOXIDE 8 09/02/2024  This result suggests low (<25) Type 2 (T2) airway inflammation indicating a low likelihood of active T2-driven airway inflammation; reduced probability of response to inhaled corticosteroids.     Assessment & Plan:     ICD-10-CM   1. Mild intermittent asthma without complication  J45.20 Nitric oxide      Meds ordered this encounter  Medications   albuterol  (VENTOLIN  HFA) 108 (90 Base) MCG/ACT inhaler    Sig: Inhale 2 puffs into the lungs every 6 (six) hours as needed for wheezing or shortness of breath.    Dispense:  153 g    Refill:  2   montelukast  (SINGULAIR ) 10 MG tablet    Sig: Take 1 tablet (10 mg total) by mouth daily.    Dispense:  90 tablet    Refill:  3   Discussion:    Mild intermittent asthma Well-controlled with no recent exacerbations. She uses albuterol  as needed and Singulair  regularly. Recent use of albuterol  a couple of nights ago without significant issues. No current dyspnea or congestion. Physical examination reveals normal lung sounds with no airway inflammation.  Nitric oxide  level low consistent with good control of type II inflammation. - Continue albuterol  as needed, prescription sent to her pharmacy. - Continue Singulair . - Advised to report any exacerbations or issues.  - Follow-up in 1 year or as needed.     Advised if symptoms do not improve or worsen, to please contact office for sooner follow up or seek emergency care.    I spent 26 minutes of dedicated to the care of this patient on the date of this encounter to include pre-visit review of records, face-to-face time with the patient discussing conditions above, post visit ordering of testing, clinical documentation with the electronic health record, making appropriate referrals as documented, and communicating necessary findings to members of the patients care team.     C. Leita Sanders, MD [facility]    *This note was  generated using voice recognition software/Dragon and/or AI transcription program.  Despite best efforts to proofread, errors can occur which can change the meaning. Any transcriptional errors that result from this process are unintentional and may not be fully corrected at the time of dictation.     [1]  Allergies Allergen Reactions   Latex Itching  [2]  Current Meds  Medication Sig   estrogens , conjugated, (PREMARIN ) 0.9 MG tablet Take 1 tablet (0.9 mg total) by mouth daily.   Galcanezumab -gnlm (EMGALITY ) 120 MG/ML SOAJ Inject 120 mg into the skin every 28 (twenty-eight) days.   IBU 800 MG tablet Take 1 tablet by mouth 2 (two) times daily as needed.   meclizine (ANTIVERT) 25 MG tablet Take 25 mg by mouth as needed.   ondansetron  (ZOFRAN ) 4 MG tablet Take 1 tablet (4 mg total) by mouth every 8 (eight) hours as needed for nausea or vomiting.   rizatriptan  (MAXALT -MLT) 10 MG disintegrating tablet TAKE 1 TABLET BY MOUTH AS NEEDED FOR MIGRAINE. MAY REPEAT AFTER 2 HOURS. MAXIMUM 2 TABLETS IN 24 HOURS.   [DISCONTINUED] albuterol  (  VENTOLIN  HFA) 108 (90 Base) MCG/ACT inhaler TAKE 2 PUFFS BY MOUTH EVERY 6 HOURS AS NEEDED FOR WHEEZE OR SHORTNESS OF BREATH   [DISCONTINUED] montelukast  (SINGULAIR ) 10 MG tablet TAKE 1 TABLET BY MOUTH EVERYDAY AT BEDTIME   "

## 2024-09-02 NOTE — Patient Instructions (Signed)
 VISIT SUMMARY:  Today, you came in for a follow-up appointment to discuss your mild intermittent asthma. You mentioned that you have been feeling pretty good and have not experienced any recent flare-ups, although you did use your albuterol  inhaler a couple of nights ago.  YOUR PLAN:  -MILD INTERMITTENT ASTHMA: Mild intermittent asthma is a condition where you experience occasional asthma symptoms that are generally well-controlled. You should continue using your albuterol  inhaler as needed and take Singulair  regularly. Your recent use of the inhaler did not cause any significant issues, and your physical examination showed normal lung sounds with no airway inflammation. Please report any exacerbations or issues you may experience.  INSTRUCTIONS:  Please continue using your albuterol  inhaler as needed and take Singulair  regularly. Make sure to report any exacerbations or issues you may experience.

## 2024-09-03 ENCOUNTER — Other Ambulatory Visit: Payer: Self-pay | Admitting: Family Medicine

## 2024-09-03 DIAGNOSIS — Z1231 Encounter for screening mammogram for malignant neoplasm of breast: Secondary | ICD-10-CM

## 2024-09-10 ENCOUNTER — Encounter: Payer: Self-pay | Admitting: Neurology

## 2024-09-25 ENCOUNTER — Ambulatory Visit
Admission: RE | Admit: 2024-09-25 | Discharge: 2024-09-25 | Disposition: A | Source: Ambulatory Visit | Attending: Family Medicine | Admitting: Family Medicine

## 2024-09-25 DIAGNOSIS — Z1231 Encounter for screening mammogram for malignant neoplasm of breast: Secondary | ICD-10-CM

## 2024-09-26 ENCOUNTER — Telehealth: Payer: Self-pay

## 2024-09-26 NOTE — Telephone Encounter (Signed)
 Per pharmacy PA is needed for Emgality 

## 2024-10-17 ENCOUNTER — Ambulatory Visit: Admitting: Neurology
